# Patient Record
Sex: Female | Born: 1985 | Race: Black or African American | Hispanic: No | Marital: Married | Smoking: Former smoker
Health system: Southern US, Community
[De-identification: ages and names within clinical notes are randomized; demographics above are authoritative.]

## PROBLEM LIST (undated history)

## (undated) DIAGNOSIS — J40 Bronchitis, not specified as acute or chronic: Secondary | ICD-10-CM

## (undated) DIAGNOSIS — Z789 Other specified health status: Secondary | ICD-10-CM

## (undated) HISTORY — PX: NO PAST SURGERIES: SHX2092

---

## 2009-09-17 ENCOUNTER — Emergency Department (HOSPITAL_COMMUNITY): Admission: EM | Admit: 2009-09-17 | Discharge: 2009-09-17 | Payer: Self-pay | Admitting: Emergency Medicine

## 2010-01-05 ENCOUNTER — Emergency Department (HOSPITAL_COMMUNITY): Admission: EM | Admit: 2010-01-05 | Discharge: 2010-01-05 | Payer: Self-pay | Admitting: Emergency Medicine

## 2010-02-20 ENCOUNTER — Emergency Department (HOSPITAL_COMMUNITY): Admission: EM | Admit: 2010-02-20 | Discharge: 2010-02-20 | Payer: Self-pay | Admitting: Emergency Medicine

## 2010-03-01 ENCOUNTER — Emergency Department (HOSPITAL_COMMUNITY): Admission: EM | Admit: 2010-03-01 | Discharge: 2010-03-01 | Payer: Self-pay | Admitting: Family Medicine

## 2010-05-07 ENCOUNTER — Emergency Department (HOSPITAL_COMMUNITY): Admission: EM | Admit: 2010-05-07 | Discharge: 2010-05-07 | Payer: Self-pay | Admitting: Emergency Medicine

## 2010-05-21 ENCOUNTER — Emergency Department (HOSPITAL_COMMUNITY)
Admission: EM | Admit: 2010-05-21 | Discharge: 2010-05-21 | Payer: Self-pay | Source: Home / Self Care | Admitting: Emergency Medicine

## 2010-07-01 ENCOUNTER — Emergency Department (HOSPITAL_COMMUNITY)
Admission: EM | Admit: 2010-07-01 | Discharge: 2010-07-01 | Payer: Self-pay | Source: Home / Self Care | Admitting: Emergency Medicine

## 2010-08-30 LAB — URINALYSIS, ROUTINE W REFLEX MICROSCOPIC
Bilirubin Urine: NEGATIVE
Glucose, UA: NEGATIVE mg/dL
Hgb urine dipstick: NEGATIVE
Protein, ur: NEGATIVE mg/dL
pH: 6 (ref 5.0–8.0)

## 2010-08-30 LAB — WET PREP, GENITAL
Clue Cells Wet Prep HPF POC: NONE SEEN
Trich, Wet Prep: NONE SEEN
WBC, Wet Prep HPF POC: NONE SEEN
Yeast Wet Prep HPF POC: NONE SEEN

## 2010-08-30 LAB — GC/CHLAMYDIA PROBE AMP, GENITAL: GC Probe Amp, Genital: NEGATIVE

## 2010-09-07 LAB — URINALYSIS, ROUTINE W REFLEX MICROSCOPIC
Bilirubin Urine: NEGATIVE
Glucose, UA: NEGATIVE mg/dL
Hgb urine dipstick: NEGATIVE
Ketones, ur: NEGATIVE mg/dL
Protein, ur: 30 mg/dL — AB
pH: 7 (ref 5.0–8.0)

## 2010-09-07 LAB — POCT PREGNANCY, URINE: Preg Test, Ur: NEGATIVE

## 2010-09-07 LAB — URINE MICROSCOPIC-ADD ON

## 2011-01-09 ENCOUNTER — Emergency Department (HOSPITAL_COMMUNITY)
Admission: EM | Admit: 2011-01-09 | Discharge: 2011-01-09 | Disposition: A | Discharge status: Home / Self Care | Payer: Medicaid Other | Attending: Emergency Medicine | Admitting: Emergency Medicine

## 2011-01-09 DIAGNOSIS — B373 Candidiasis of vulva and vagina: Secondary | ICD-10-CM | POA: Insufficient documentation

## 2011-01-09 DIAGNOSIS — N9089 Other specified noninflammatory disorders of vulva and perineum: Secondary | ICD-10-CM | POA: Insufficient documentation

## 2011-01-09 DIAGNOSIS — N949 Unspecified condition associated with female genital organs and menstrual cycle: Secondary | ICD-10-CM | POA: Insufficient documentation

## 2011-01-09 DIAGNOSIS — L293 Anogenital pruritus, unspecified: Secondary | ICD-10-CM | POA: Insufficient documentation

## 2011-01-09 DIAGNOSIS — N898 Other specified noninflammatory disorders of vagina: Secondary | ICD-10-CM | POA: Insufficient documentation

## 2011-01-09 DIAGNOSIS — B3731 Acute candidiasis of vulva and vagina: Secondary | ICD-10-CM | POA: Insufficient documentation

## 2011-01-09 LAB — WET PREP, GENITAL: Trich, Wet Prep: NONE SEEN

## 2011-01-09 LAB — URINALYSIS, ROUTINE W REFLEX MICROSCOPIC
Glucose, UA: NEGATIVE mg/dL
Hgb urine dipstick: NEGATIVE
Ketones, ur: NEGATIVE mg/dL
Leukocytes, UA: NEGATIVE
Protein, ur: NEGATIVE mg/dL
pH: 6 (ref 5.0–8.0)

## 2011-01-09 LAB — POCT PREGNANCY, URINE: Preg Test, Ur: NEGATIVE

## 2011-10-11 ENCOUNTER — Encounter (HOSPITAL_COMMUNITY): Payer: Self-pay

## 2011-10-11 ENCOUNTER — Emergency Department (HOSPITAL_COMMUNITY)
Admission: EM | Admit: 2011-10-11 | Discharge: 2011-10-11 | Disposition: A | Discharge status: Home / Self Care | Payer: Self-pay | Attending: Emergency Medicine | Admitting: Emergency Medicine

## 2011-10-11 DIAGNOSIS — N920 Excessive and frequent menstruation with regular cycle: Secondary | ICD-10-CM | POA: Insufficient documentation

## 2011-10-11 DIAGNOSIS — N898 Other specified noninflammatory disorders of vagina: Secondary | ICD-10-CM | POA: Insufficient documentation

## 2011-10-11 DIAGNOSIS — N760 Acute vaginitis: Secondary | ICD-10-CM | POA: Insufficient documentation

## 2011-10-11 DIAGNOSIS — B9689 Other specified bacterial agents as the cause of diseases classified elsewhere: Secondary | ICD-10-CM | POA: Insufficient documentation

## 2011-10-11 DIAGNOSIS — M549 Dorsalgia, unspecified: Secondary | ICD-10-CM | POA: Insufficient documentation

## 2011-10-11 DIAGNOSIS — N949 Unspecified condition associated with female genital organs and menstrual cycle: Secondary | ICD-10-CM | POA: Insufficient documentation

## 2011-10-11 DIAGNOSIS — A499 Bacterial infection, unspecified: Secondary | ICD-10-CM | POA: Insufficient documentation

## 2011-10-11 DIAGNOSIS — R109 Unspecified abdominal pain: Secondary | ICD-10-CM | POA: Insufficient documentation

## 2011-10-11 LAB — URINE MICROSCOPIC-ADD ON

## 2011-10-11 LAB — CBC
HCT: 39.3 % (ref 36.0–46.0)
MCV: 88.9 fL (ref 78.0–100.0)
Platelets: 260 10*3/uL (ref 150–400)
RBC: 4.42 MIL/uL (ref 3.87–5.11)
RDW: 12.5 % (ref 11.5–15.5)
WBC: 3 10*3/uL — ABNORMAL LOW (ref 4.0–10.5)

## 2011-10-11 LAB — URINALYSIS, ROUTINE W REFLEX MICROSCOPIC
Glucose, UA: NEGATIVE mg/dL
Leukocytes, UA: NEGATIVE
Protein, ur: 30 mg/dL — AB
Specific Gravity, Urine: 1.026 (ref 1.005–1.030)
Urobilinogen, UA: 0.2 mg/dL (ref 0.0–1.0)

## 2011-10-11 LAB — WET PREP, GENITAL
Trich, Wet Prep: NONE SEEN
Yeast Wet Prep HPF POC: NONE SEEN

## 2011-10-11 LAB — BASIC METABOLIC PANEL
CO2: 24 mEq/L (ref 19–32)
Chloride: 102 mEq/L (ref 96–112)
Glucose, Bld: 95 mg/dL (ref 70–99)
Sodium: 138 mEq/L (ref 135–145)

## 2011-10-11 LAB — POCT PREGNANCY, URINE: Preg Test, Ur: NEGATIVE

## 2011-10-11 LAB — DIFFERENTIAL
Basophils Absolute: 0 10*3/uL (ref 0.0–0.1)
Lymphocytes Relative: 46 % (ref 12–46)
Lymphs Abs: 1.4 10*3/uL (ref 0.7–4.0)
Neutro Abs: 1.3 10*3/uL — ABNORMAL LOW (ref 1.7–7.7)

## 2011-10-11 MED ORDER — KETOROLAC TROMETHAMINE 60 MG/2ML IM SOLN
60.0000 mg | Freq: Once | INTRAMUSCULAR | Status: AC
  Administered 2011-10-11: 60 mg via INTRAMUSCULAR
  Filled 2011-10-11: qty 2

## 2011-10-11 MED ORDER — POTASSIUM CHLORIDE CRYS ER 20 MEQ PO TBCR
40.0000 meq | EXTENDED_RELEASE_TABLET | Freq: Once | ORAL | Status: AC
  Administered 2011-10-11: 40 meq via ORAL
  Filled 2011-10-11: qty 2

## 2011-10-11 MED ORDER — METRONIDAZOLE 500 MG PO TABS: 500.0000 mg | ORAL_TABLET | Freq: Two times a day (BID) | ORAL | Status: AC

## 2011-10-11 MED ORDER — OXYCODONE-ACETAMINOPHEN 5-325 MG PO TABS
1.0000 | ORAL_TABLET | Freq: Once | ORAL | Status: AC
  Administered 2011-10-11: 1 via ORAL
  Filled 2011-10-11: qty 1

## 2011-10-11 MED ORDER — METRONIDAZOLE 500 MG PO TABS: 500.0000 mg | ORAL_TABLET | Freq: Two times a day (BID) | ORAL | Status: DC

## 2011-10-11 NOTE — ED Notes (Signed)
Patient reporting heavy vaginal bleeding during last menstrual cycle which lasted for approx 7 days.  Patient reporting this months cycle started on 10-04-11 and is lighter but has been associated with right flank pain and abdominal cramping.  Patient reporting vaginal bleeding for 8 days and goes through 1 tampon every 4 hours.  Patient reports she normally does not bleed for this long.

## 2011-10-11 NOTE — ED Provider Notes (Signed)
History     CSN: [id number]  Arrival date & time 10/11/11  0809   First MD Initiated Contact with Patient 10/11/11 0818      Chief Complaint  Patient presents with  . Vaginal Bleeding    (Consider location/radiation/quality/duration/timing/severity/associated sxs/prior treatment) HPI Comments: Patient with no significant past medical history presents emergency department chief complaint of vaginal bleeding and abdominal pain.  LMP current, started 4/17, usually lasts 3-4 days. Using 1 super tampon q 2-3 hours Last month lasted 7 days and was heavy, associated with abdominal cramping & back pain relieved with warm bath. G1P1, early delivery, no other complications (daughter is 8). Pt does have OBGYN at [facility] for yearly check ups with no abnormalities last year in June. No hx of prior STDs. Pt is currently sexually active, but does not use protection & is not on birth control.  Associated s/s include back pain and "pain in my vagina area". Onset was when cycle started, pain is described as sharp "labor pain", Not associated w N/V/D, LNBM this morning. Severity is 10/10 intermittent in nature lasting about an hour or 2. Taken grandmothers hydrocodine with no relief.  No other complaints at this time  Patient is a 26 y.o. female presenting with vaginal bleeding.  Vaginal Bleeding This is a new problem. The current episode started more than 1 month ago. The problem occurs intermittently. The problem has been waxing and waning. Associated symptoms include abdominal pain. Pertinent negatives include no anorexia, arthralgias, change in bowel habit, chest pain, chills, congestion, coughing, diaphoresis, fatigue, fever, headaches, joint swelling, myalgias, nausea, neck pain, numbness, rash, sore throat, swollen glands, urinary symptoms, vertigo, visual change, vomiting or weakness. Associated symptoms comments: No abnormal vaginal discharge. The symptoms are aggravated by nothing. She has tried nothing  for the symptoms.    History reviewed. No pertinent past medical history.  History reviewed. No pertinent past surgical history.  No family history on file.  History  Substance Use Topics  . Smoking status: Former Games developer  . Smokeless tobacco: Not on file  . Alcohol Use: Yes    OB History    Grav Para Term Preterm Abortions TAB SAB Ect Mult Living   1 1  1      1       Review of Systems  Constitutional: Negative for fever, chills, diaphoresis and fatigue.  HENT: Negative for congestion, sore throat and neck pain.   Respiratory: Negative for cough.   Cardiovascular: Negative for chest pain.  Gastrointestinal: Positive for abdominal pain. Negative for nausea, vomiting, anorexia and change in bowel habit.  Genitourinary: Positive for vaginal bleeding.  Musculoskeletal: Negative for myalgias, joint swelling and arthralgias.  Skin: Negative for rash.  Neurological: Negative for vertigo, weakness, numbness and headaches.  All other systems reviewed and are negative.    Allergies  Review of patient's allergies indicates no known allergies.  Home Medications   Current Outpatient Rx  Name Route Sig Dispense Refill  . ACETAMINOPHEN 500 MG PO TABS Oral Take 2,000 mg by mouth every 6 (six) hours as needed. For pain.      BP 120/82  Pulse 72  Temp(Src) 98.6 F (37 C) (Oral)  Resp 16  SpO2 100%  LMP 10/04/2011  Physical Exam  Nursing note and vitals reviewed. Constitutional: She is oriented to person, place, and time. She appears well-developed and well-nourished. No distress.  HENT:  Head: Normocephalic and atraumatic.  Eyes: Conjunctivae and EOM are normal.  Neck: Normal range of motion.  Cardiovascular:       Regular rate and rhythm, no abnormal sounds auscultation  Pulmonary/Chest: Effort normal.       Lungs clear to auscultation bilaterally  Abdominal:         Soft, nondistended abdomen with normal bowel sounds.  Suprapubic tenderness to palpation.     Genitourinary:       No CVA tenderness Exam performed by Jaci Carrel,  exam chaperoned Date: 10/11/2011 Pelvic exam: normal external genitalia without evidence of trauma. VULVA: normal appearing vulva with no masses, tenderness or lesion. VAGINA: normal appearing vagina with normal color and discharge, no lesions. CERVIX: normal appearing cervix without lesions, cervical motion tenderness absent, cervical os closed with out purulent discharge; vaginal discharge - clear, Wet prep and DNA probe for chlamydia and GC obtained. Blood present    ADNEXA: normal adnexa in size, nontender and no masses UTERUS: uterus is normal size, shape, consistency and nontender.    Musculoskeletal: Normal range of motion.  Neurological: She is alert and oriented to person, place, and time.  Skin: Skin is warm and dry. No rash noted. She is not diaphoretic.  Psychiatric: She has a normal mood and affect. Her behavior is normal.    ED Course  Procedures (including critical care time)  Labs Reviewed  URINALYSIS, ROUTINE W REFLEX MICROSCOPIC - Abnormal; Notable for the following:    APPearance CLOUDY (*)    Hgb urine dipstick LARGE (*)    Protein, ur 30 (*)    All other components within normal limits  CBC - Abnormal; Notable for the following:    WBC 3.0 (*)    All other components within normal limits  DIFFERENTIAL - Abnormal; Notable for the following:    Neutro Abs 1.3 (*)    All other components within normal limits  BASIC METABOLIC PANEL - Abnormal; Notable for the following:    Potassium 3.3 (*)    All other components within normal limits  WET PREP, GENITAL - Abnormal; Notable for the following:    Clue Cells Wet Prep HPF POC RARE (*)    WBC, Wet Prep HPF POC RARE (*)    All other components within normal limits  URINE MICROSCOPIC-ADD ON - Abnormal; Notable for the following:    Squamous Epithelial / LPF MANY (*)    Casts HYALINE CASTS (*)    All other components within normal limits   POCT PREGNANCY, URINE  GC/CHLAMYDIA PROBE AMP, GENITAL   No results found.   No diagnosis found.    MDM  Menorrhagia  Patient has been advised to followup with her OB/GYN for further evaluation of her menorrhagia.  Patient will be treated for bacterial vaginosis.  Patient has mild hypokalemia which has been replaced while in the emergency department.  Patient has been hemodynamically stable in no acute distress throughout the entire hospital stay and prior to discharge patient is asymptomatic.  Patient has been educated not to drink alcohol while taking Flagyl.        Jaci Carrel, PA-C 10/11/11 [address] [place] 10/11/11 1202

## 2011-10-11 NOTE — Discharge Instructions (Signed)

## 2011-10-12 LAB — GC/CHLAMYDIA PROBE AMP, GENITAL: GC Probe Amp, Genital: NEGATIVE

## 2011-10-14 NOTE — ED Provider Notes (Signed)
Medical screening examination/treatment/procedure(s) were performed by non-physician practitioner and as supervising physician I was immediately available for consultation/collaboration.  Jermayne Sweeney R. Eiman Maret, MD 10/14/11 0701 

## 2012-07-09 ENCOUNTER — Encounter (HOSPITAL_COMMUNITY): Payer: Self-pay | Admitting: Emergency Medicine

## 2012-07-09 ENCOUNTER — Emergency Department (HOSPITAL_COMMUNITY): Payer: Self-pay

## 2012-07-09 ENCOUNTER — Emergency Department (HOSPITAL_COMMUNITY)
Admission: EM | Admit: 2012-07-09 | Discharge: 2012-07-09 | Disposition: A | Discharge status: Home / Self Care | Payer: Self-pay | Attending: Emergency Medicine | Admitting: Emergency Medicine

## 2012-07-09 DIAGNOSIS — Z3202 Encounter for pregnancy test, result negative: Secondary | ICD-10-CM | POA: Insufficient documentation

## 2012-07-09 DIAGNOSIS — R197 Diarrhea, unspecified: Secondary | ICD-10-CM | POA: Insufficient documentation

## 2012-07-09 DIAGNOSIS — R5381 Other malaise: Secondary | ICD-10-CM | POA: Insufficient documentation

## 2012-07-09 DIAGNOSIS — R61 Generalized hyperhidrosis: Secondary | ICD-10-CM | POA: Insufficient documentation

## 2012-07-09 DIAGNOSIS — J029 Acute pharyngitis, unspecified: Secondary | ICD-10-CM | POA: Insufficient documentation

## 2012-07-09 DIAGNOSIS — R0602 Shortness of breath: Secondary | ICD-10-CM | POA: Insufficient documentation

## 2012-07-09 DIAGNOSIS — Z87891 Personal history of nicotine dependence: Secondary | ICD-10-CM | POA: Insufficient documentation

## 2012-07-09 DIAGNOSIS — R079 Chest pain, unspecified: Secondary | ICD-10-CM | POA: Insufficient documentation

## 2012-07-09 DIAGNOSIS — R112 Nausea with vomiting, unspecified: Secondary | ICD-10-CM | POA: Insufficient documentation

## 2012-07-09 DIAGNOSIS — J40 Bronchitis, not specified as acute or chronic: Secondary | ICD-10-CM | POA: Insufficient documentation

## 2012-07-09 MED ORDER — AEROCHAMBER Z-STAT PLUS/MEDIUM MISC
1.0000 | Freq: Once | Status: AC
  Administered 2012-07-09: 1

## 2012-07-09 MED ORDER — IBUPROFEN 600 MG PO TABS: 600.0000 mg | ORAL_TABLET | Freq: Four times a day (QID) | ORAL | Status: DC | PRN

## 2012-07-09 MED ORDER — IBUPROFEN 200 MG PO TABS
600.0000 mg | ORAL_TABLET | Freq: Once | ORAL | Status: AC
  Administered 2012-07-09: 600 mg via ORAL
  Filled 2012-07-09: qty 3

## 2012-07-09 MED ORDER — ALBUTEROL SULFATE HFA 108 (90 BASE) MCG/ACT IN AERS
2.0000 | INHALATION_SPRAY | RESPIRATORY_TRACT | Status: DC | PRN
  Administered 2012-07-09: 2 via RESPIRATORY_TRACT
  Filled 2012-07-09: qty 6.7

## 2012-07-09 NOTE — ED Provider Notes (Signed)
History     CSN: [id number]  Arrival date & time 07/09/12  4098   First MD Initiated Contact with Patient 07/09/12 [phone]      Chief Complaint  Patient presents with  . Cough  . Shortness of Breath    (Consider location/radiation/quality/duration/timing/severity/associated sxs/prior treatment) HPI Comments: Patient presents with 4 day history of a non productive cough and SOB.  She states her chest is sore from coughing so much.  She reports rhinorrhea and head congestion.  She states that she has experienced intermittent episodes of vomiting and diarrhea over the past 24 hours.  She does not notice blood in her vomit or stool.  She denies fever.  Patient reports that family and friends have had similar symptoms.  She did not receive the flu vaccine this year.  She has tried to take Robitussin and has been drinking herbal tea which has not been helping.  No recent surgeries or long travel, no estrogen use, no h/o of blood clots, no lower extremity swelling.  Onset gradual, course constant.    Patient is a 27 y.o. female presenting with cough and shortness of breath. The history is provided by the patient.  Cough Associated symptoms include chest pain, rhinorrhea, sore throat and shortness of breath. Pertinent negatives include no chills, no ear pain, no headaches and no wheezing.  Shortness of Breath  Associated symptoms include chest pain, rhinorrhea, sore throat, cough and shortness of breath. Pertinent negatives include no fever and no wheezing.    History reviewed. No pertinent past medical history.  History reviewed. No pertinent past surgical history.  History reviewed. No pertinent family history.  History  Substance Use Topics  . Smoking status: Former Games developer  . Smokeless tobacco: Not on file  . Alcohol Use: Yes    OB History    Grav Para Term Preterm Abortions TAB SAB Ect Mult Living   1 1  1      1       Review of Systems  Constitutional: Positive for diaphoresis,  appetite change and fatigue. Negative for fever and chills.  HENT: Positive for congestion, sore throat, rhinorrhea and sinus pressure. Negative for ear pain and neck pain.   Eyes: Negative for pain and discharge.  Respiratory: Positive for cough and shortness of breath. Negative for choking and wheezing.   Cardiovascular: Positive for chest pain.  Gastrointestinal: Positive for nausea, vomiting and diarrhea. Negative for abdominal pain, constipation and blood in stool.  Genitourinary: Negative for dysuria and urgency.  Skin: Negative for rash.  Neurological: Negative for dizziness, light-headedness and headaches.    Allergies  Review of patient's allergies indicates no known allergies.  Home Medications   Current Outpatient Rx  Name  Route  Sig  Dispense  Refill  . ACETAMINOPHEN 500 MG PO TABS   Oral   Take 2,000 mg by mouth every 6 (six) hours as needed. For pain.           BP 129/93  Pulse 107  Temp 98.8 F (37.1 C) (Oral)  Resp 20  SpO2 96%  Physical Exam  Constitutional: She is oriented to person, place, and time. She appears well-developed and well-nourished.  HENT:  Head: Normocephalic and atraumatic.  Right Ear: Tympanic membrane and external ear normal.  Left Ear: Tympanic membrane and external ear normal.  Nose: Nose normal.  Mouth/Throat: Oropharynx is clear and moist. No oropharyngeal exudate.  Eyes: Conjunctivae normal are normal. Pupils are equal, round, and reactive to light.  Neck: Normal  range of motion. Neck supple.  Cardiovascular: Normal rate, regular rhythm and normal heart sounds.   Pulmonary/Chest: Effort normal. She has wheezes.  Abdominal: Soft. Bowel sounds are normal. There is no tenderness.  Lymphadenopathy:    She has no cervical adenopathy.  Neurological: She is alert and oriented to person, place, and time.  Skin: Skin is warm and dry.    ED Course  Procedures (including critical care time)   Labs Reviewed  PREGNANCY, URINE    Dg Chest 2 View  07/09/2012  *RADIOLOGY REPORT*  Clinical Data: Cough, congestion, and shortness of breath.  Mid chest pain.  CHEST - 2 VIEW  Comparison: None.  Findings: Symmetric nodular densities compatible with nipple shadows project over the lung bases.  Airway thickening may reflect bronchitis or reactive airways disease.  No airspace opacity is identified to suggest bacterial pneumonia pattern.   Cardiac and mediastinal contours appear unremarkable.  No pleural effusion identified.  IMPRESSION:  1. Airway thickening may reflect bronchitis or reactive airways disease.  No airspace opacity is identified to suggest bacterial pneumonia pattern.   Original Report Authenticated By: Gaylyn Rong, M.D.      1. Bronchitis     Patient seen and examined. Work-up initiated.   Vital signs reviewed and are as follows: Filed Vitals:   07/09/12 0637  BP: 129/93  Pulse: 107  Temp: 98.8 F (37.1 C)  Resp: 20   CXR results reviewed.   Patient counseled on use of albuterol HFA.  Told to use 1-2 puffs q 4 hours as needed for SOB.  Patient counseled on supportive care for viral URI/bronchitis and s/s to return including worsening symptoms, persistent fever, persistent vomiting, or if they have any other concerns.  Urged to see PCP if symptoms persist for more than 3 days. Patient verbalizes understanding and agrees with plan.   BP 139/88  Pulse 100  Temp 98.8 F (37.1 C) (Oral)  Resp 20  SpO2 99%  LMP 06/18/2012  MDM  Patient with likely viral bronchitis. No concern for PNA given normal lung exam/x-ray. Antibiotics not indicated. Conservative therapy indicated. Do not suspect PE: no leg swelling, h/o blood clots, pleuritic CP, estrogen use, smoking, recent surgery or immobilization.          Renne Crigler, [place] 07/09/12 1105

## 2012-07-09 NOTE — ED Notes (Signed)
[id number]> Expected date:<BR> Expected time:<BR> Means of arrival:<BR> Comments:<BR> EMS/26 yo female with cough and SOB

## 2012-07-09 NOTE — ED Notes (Signed)
Brought in by EMS from home with c/o cough x 4 days now with shortness of breath.

## 2012-07-09 NOTE — ED Notes (Signed)
Pt reports frequent cough and congestions that preceded chest pain 7/10 nonradiating that increases with deep breathing. Also reports intermittant fever and body aches . Admits to taking OTC Robatussin with minimal relief oc cough.

## 2012-07-14 NOTE — ED Provider Notes (Signed)
Medical screening examination/treatment/procedure(s) were performed by non-physician practitioner and as supervising physician I was immediately available for consultation/collaboration.  Tobin Chad, MD 07/14/12 [phone]

## 2012-11-18 ENCOUNTER — Emergency Department (HOSPITAL_COMMUNITY)
Admission: EM | Admit: 2012-11-18 | Discharge: 2012-11-18 | Disposition: A | Discharge status: Home / Self Care | Payer: Medicaid Other | Attending: Emergency Medicine | Admitting: Emergency Medicine

## 2012-11-18 ENCOUNTER — Other Ambulatory Visit: Payer: Medicaid Other

## 2012-11-18 ENCOUNTER — Encounter (HOSPITAL_COMMUNITY): Payer: Self-pay | Admitting: Emergency Medicine

## 2012-11-18 DIAGNOSIS — N6320 Unspecified lump in the left breast, unspecified quadrant: Secondary | ICD-10-CM

## 2012-11-18 DIAGNOSIS — Z87891 Personal history of nicotine dependence: Secondary | ICD-10-CM | POA: Insufficient documentation

## 2012-11-18 DIAGNOSIS — N63 Unspecified lump in unspecified breast: Secondary | ICD-10-CM

## 2012-11-18 NOTE — ED Notes (Signed)
Patient states that she has a lump in left breast that hurts.  Patient states she went to health department and they told her to get it checked out.

## 2012-11-18 NOTE — ED Provider Notes (Signed)
History     CSN: [id number]  Arrival date & time 11/18/12  0718   First MD Initiated Contact with Patient 11/18/12 [phone]      Chief Complaint  Patient presents with  . Breast Pain    (Consider location/radiation/quality/duration/timing/severity/associated sxs/prior treatment) Patient is a 27 y.o. female presenting with general illness. The history is provided by the patient.  Illness Location:  Pt has noted a mass in the left breats.  She says that her breasts become tender before menses.  She has noted a mass in the left breast.  She went to the [facility] on Friday, 3 days ago, and was advised to "have it checked." Severity:  Moderate Duration: Noted by a doctor at the [facility] 3 days ago. Timing:  Constant Progression:  Unchanged Chronicity:  New Associated symptoms: no fever     History reviewed. No pertinent past medical history.  History reviewed. No pertinent past surgical history.  No family history on file.  History  Substance Use Topics  . Smoking status: Former Games developer  . Smokeless tobacco: Not on file  . Alcohol Use: Yes    OB History   Grav Para Term Preterm Abortions TAB SAB Ect Mult Living   1 1  1      1       Review of Systems  Constitutional: Negative for fever and chills.  HENT: Negative.   Eyes: Negative.   Respiratory: Negative.   Cardiovascular: Negative.   Gastrointestinal: Negative.   Genitourinary: Negative.        Mass in left breast.  Musculoskeletal: Negative.   Skin: Negative.   Neurological: Negative.   Psychiatric/Behavioral: Negative.     Allergies  Review of patient's allergies indicates no known allergies.  Home Medications   Current Outpatient Rx  Name  Route  Sig  Dispense  Refill  . acetaminophen (TYLENOL) 500 MG tablet   Oral   Take 2,000 mg by mouth every 6 (six) hours as needed. For pain.         Marland Kitchen ibuprofen (ADVIL,MOTRIN) 600 MG tablet   Oral   Take 1 tablet (600 mg total) by mouth every 6  (six) hours as needed for pain.   20 tablet   0     BP 119/62  Pulse 73  Temp(Src) 98 F (36.7 C) (Oral)  Resp 15  Ht 5\' 6"  (1.676 m)  Wt 122 lb (55.339 kg)  BMI 19.7 kg/m2  SpO2 100%  LMP 11/03/2012  Physical Exam  Constitutional: She is oriented to person, place, and time. She appears well-developed and well-nourished. No distress.  HENT:  Head: Normocephalic and atraumatic.  Eyes: Conjunctivae and EOM are normal.  Neck: Normal range of motion. Neck supple.  Cardiovascular: Normal rate, regular rhythm and normal heart sounds.   Pulmonary/Chest: Effort normal and breath sounds normal.  She has a 2 x 2 mass in the left breast in the left lateral area.  The skin is intact.  There is no nipple discharge.  There is no axillary adenopathy.  Her right breast is normal, without any mass.  Abdominal: Soft. Bowel sounds are normal.  Neurological: She is alert and oriented to person, place, and time.  No sensory or motor deficit.  Skin: Skin is warm and dry.  Psychiatric: She has a normal mood and affect. Her behavior is normal.    ED Course  Procedures (including critical care time)  7:43 AM Call to [facility] at [phone] to arrange for mammogram.  7:51 AM Pt to have ultrasound of the left breast at 8:45 A.M. Today at the [facility].      1. Mass of breast, left            Carleene Cooper III, MD 11/18/12 [phone]

## 2012-11-20 ENCOUNTER — Telehealth (HOSPITAL_COMMUNITY): Payer: Self-pay | Admitting: *Deleted

## 2012-11-20 NOTE — Telephone Encounter (Signed)
Telephoned patient at home # and left message to return call to [facility] 

## 2013-01-21 ENCOUNTER — Encounter (HOSPITAL_COMMUNITY): Payer: Self-pay | Admitting: Emergency Medicine

## 2013-01-21 ENCOUNTER — Emergency Department (INDEPENDENT_AMBULATORY_CARE_PROVIDER_SITE_OTHER)
Admission: EM | Admit: 2013-01-21 | Discharge: 2013-01-21 | Disposition: A | Discharge status: Home / Self Care | Payer: Medicaid Other | Source: Home / Self Care

## 2013-01-21 DIAGNOSIS — K0889 Other specified disorders of teeth and supporting structures: Secondary | ICD-10-CM

## 2013-01-21 DIAGNOSIS — K089 Disorder of teeth and supporting structures, unspecified: Secondary | ICD-10-CM

## 2013-01-21 MED ORDER — AMOXICILLIN 500 MG PO CAPS: 500.0000 mg | ORAL_CAPSULE | Freq: Three times a day (TID) | ORAL | Status: DC

## 2013-01-21 MED ORDER — HYDROCODONE-ACETAMINOPHEN 5-325 MG PO TABS: 1.0000 | ORAL_TABLET | Freq: Four times a day (QID) | ORAL | Status: DC | PRN

## 2013-01-21 NOTE — ED Notes (Signed)
C/o right side wisdom tooth pain which started three weeks ago.   Aleve taken.   Dentist states to patient he doesn't pull out but did refer her [place] [address].

## 2013-01-21 NOTE — ED Provider Notes (Signed)
Linda Mcmahon is a 27 y.o. female who presents to Urgent Care today for dental pain. Patient has significant pain in her right lower wisdom tooth. She was seen by Maurine Minister 3 weeks ago and diagnosed with acute impaction referred her to a oral surgeon. However she just received Medicaid and would like to be referred back to the oral surgeon if possible. She notes her pain is continued to worsen. She's tried over-the-counter pain medications which have not helped yet. She notes the pain is moderate to severe and worse with eating. She denies any fevers or chills or trouble swallowing. She feels well otherwise.    PMH reviewed. Healthy otherwise History  Substance Use Topics  . Smoking status: Former Games developer  . Smokeless tobacco: Not on file  . Alcohol Use: Yes   ROS as above Medications reviewed. No current facility-administered medications for this encounter.   Current Outpatient Prescriptions  Medication Sig Dispense Refill  . amoxicillin (AMOXIL) 500 MG capsule Take 1 capsule (500 mg total) by mouth 3 (three) times daily.  60 capsule  0  . HYDROcodone-acetaminophen (NORCO) 5-325 MG per tablet Take 1 tablet by mouth every 6 (six) hours as needed for pain.  20 tablet  0    Exam:  BP 119/79  Pulse 66  Temp(Src) 97.9 F (36.6 C) (Oral)  Resp 16  SpO2 100%  LMP 01/16/2013 Gen: Well NAD HEENT: EOMI,  MMM, erythematous appearing gums behind the last molar on the bottom right.  Tender to touch. Molars and premolars are normal appearing Lungs: CTABL Nl WOB Heart: RRR no MRG Abd: NABS, NT, ND Exts: Non edematous BL  LE, warm and well perfused.   No results found for this or any previous visit (from the past 24 hour(s)). No results found.  Assessment and Plan: 27 y.o. female with wisdom tooth pain. Plan to treat with amoxicillin and hydrocodone. Encourage patient to follow back up with her dentist for referral to oral surgeon.  Discussed warning signs or symptoms. Please see discharge  instructions. Patient expresses understanding.    3  Rodolph Bong, MD 01/21/13 [phone]

## 2013-02-17 ENCOUNTER — Encounter (HOSPITAL_COMMUNITY): Payer: Self-pay

## 2013-02-17 ENCOUNTER — Emergency Department (HOSPITAL_COMMUNITY)
Admission: EM | Admit: 2013-02-17 | Discharge: 2013-02-17 | Disposition: A | Discharge status: Home / Self Care | Payer: Medicaid Other | Attending: Emergency Medicine | Admitting: Emergency Medicine

## 2013-02-17 DIAGNOSIS — R51 Headache: Secondary | ICD-10-CM | POA: Insufficient documentation

## 2013-02-17 DIAGNOSIS — Z792 Long term (current) use of antibiotics: Secondary | ICD-10-CM | POA: Insufficient documentation

## 2013-02-17 DIAGNOSIS — Z3202 Encounter for pregnancy test, result negative: Secondary | ICD-10-CM | POA: Insufficient documentation

## 2013-02-17 DIAGNOSIS — N949 Unspecified condition associated with female genital organs and menstrual cycle: Secondary | ICD-10-CM | POA: Insufficient documentation

## 2013-02-17 DIAGNOSIS — Z87891 Personal history of nicotine dependence: Secondary | ICD-10-CM | POA: Insufficient documentation

## 2013-02-17 DIAGNOSIS — N938 Other specified abnormal uterine and vaginal bleeding: Secondary | ICD-10-CM | POA: Insufficient documentation

## 2013-02-17 LAB — WET PREP, GENITAL: Trich, Wet Prep: NONE SEEN

## 2013-02-17 LAB — URINALYSIS W MICROSCOPIC + REFLEX CULTURE
Bilirubin Urine: NEGATIVE
Hgb urine dipstick: NEGATIVE
Protein, ur: NEGATIVE mg/dL
Specific Gravity, Urine: 1.025 (ref 1.005–1.030)
Urobilinogen, UA: 1 mg/dL (ref 0.0–1.0)

## 2013-02-17 LAB — POCT I-STAT, CHEM 8
Calcium, Ion: 1.2 mmol/L (ref 1.12–1.23)
Glucose, Bld: 88 mg/dL (ref 70–99)
HCT: 36 % (ref 36.0–46.0)
Hemoglobin: 12.2 g/dL (ref 12.0–15.0)

## 2013-02-17 LAB — POCT PREGNANCY, URINE: Preg Test, Ur: NEGATIVE

## 2013-02-17 NOTE — ED Provider Notes (Signed)
CSN: [id number]     Arrival date & time 02/17/13  0856 History   First MD Initiated Contact with Patient 02/17/13 0901     Chief Complaint  Patient presents with  . Vaginal Bleeding   (Consider location/radiation/quality/duration/timing/severity/associated sxs/prior Treatment) HPI Patient is a 27 year old female who presents to emergency department with complaint of vaginal bleeding and headaches. Patient states that her vaginal bleeding began 8 days ago and has been persistent over that time. States her last period prior to that was on August 1 which was 30 days ago, states it was normal. States she usually has states she is normally regular. Patient denies any abdominal pain or any other vaginal complaints. Patient denies any fever, chills, nausea, vomiting. States she has had severe headaches in the last few days. Patient also admits to being stressed the last several weeks. States that she has not been sleeping or eating well. Patient believes headache may be attributed to lack of sleep. Patient denies taking any medications right now except for amoxicillin she was given for a dental abscess. Patient not sure if she's pregnant. History reviewed. No pertinent past medical history. History reviewed. No pertinent past surgical history. History reviewed. No pertinent family history. History  Substance Use Topics  . Smoking status: Former Games developer  . Smokeless tobacco: Not on file  . Alcohol Use: Yes   OB History   Grav Para Term Preterm Abortions TAB SAB Ect Mult Living   1 1  1      1      Review of Systems  Constitutional: Negative for fever and chills.  HENT: Negative for neck pain and neck stiffness.   Respiratory: Negative for cough, chest tightness and shortness of breath.   Cardiovascular: Negative for chest pain, palpitations and leg swelling.  Gastrointestinal: Negative for nausea, vomiting, abdominal pain and diarrhea.  Genitourinary: Positive for vaginal bleeding. Negative for  dysuria, flank pain, vaginal discharge, vaginal pain and pelvic pain.  Musculoskeletal: Negative for myalgias and arthralgias.  Skin: Negative for rash.  Neurological: Positive for headaches. Negative for dizziness and weakness.  All other systems reviewed and are negative.    Allergies  Review of patient's allergies indicates no known allergies.  Home Medications   Current Outpatient Rx  Name  Route  Sig  Dispense  Refill  . ibuprofen (ADVIL,MOTRIN) 200 MG tablet   Oral   Take 600 mg by mouth every 6 (six) hours as needed for pain.         Marland Kitchen amoxicillin (AMOXIL) 500 MG capsule   Oral   Take 1 capsule (500 mg total) by mouth 3 (three) times daily.   60 capsule   0    BP 111/60  Pulse 65  Temp(Src) 98 F (36.7 C) (Oral)  Resp 20  Ht 5\' 5"  (1.651 m)  Wt 125 lb (56.7 kg)  BMI 20.8 kg/m2  SpO2 99%  LMP 02/17/2013 Physical Exam  Nursing note and vitals reviewed. Constitutional: She appears well-developed and well-nourished. No distress.  HENT:  Head: Normocephalic.  Eyes: Conjunctivae are normal.  Neck: Neck supple.  Cardiovascular: Normal rate, regular rhythm and normal heart sounds.   Pulmonary/Chest: Effort normal and breath sounds normal. No respiratory distress. She has no wheezes. She has no rales.  Abdominal: Soft. Bowel sounds are normal. She exhibits no distension. There is no tenderness. There is no rebound.  Genitourinary: Vagina normal and uterus normal. Cervix exhibits no motion tenderness. Right adnexum displays no mass and no tenderness. Left adnexum  displays no mass and no tenderness. No erythema, tenderness or bleeding around the vagina. No vaginal discharge found.  Musculoskeletal: She exhibits no edema.  Neurological: She is alert.  Skin: Skin is warm and dry.  Psychiatric: She has a normal mood and affect. Her behavior is normal.    ED Course  Procedures (including critical care time)  Results for orders placed during the hospital encounter of  02/17/13  WET PREP, GENITAL      Result Value Range   Yeast Wet Prep HPF POC NONE SEEN  NONE SEEN   Trich, Wet Prep NONE SEEN  NONE SEEN   Clue Cells Wet Prep HPF POC NONE SEEN  NONE SEEN   WBC, Wet Prep HPF POC FEW (*) NONE SEEN  URINALYSIS W MICROSCOPIC + REFLEX CULTURE      Result Value Range   Color, Urine YELLOW  YELLOW   APPearance CLOUDY (*) CLEAR   Specific Gravity, Urine 1.025  1.005 - 1.030   pH 6.0  5.0 - 8.0   Glucose, UA NEGATIVE  NEGATIVE mg/dL   Hgb urine dipstick NEGATIVE  NEGATIVE   Bilirubin Urine NEGATIVE  NEGATIVE   Ketones, ur NEGATIVE  NEGATIVE mg/dL   Protein, ur NEGATIVE  NEGATIVE mg/dL   Urobilinogen, UA 1.0  0.0 - 1.0 mg/dL   Nitrite NEGATIVE  NEGATIVE   Leukocytes, UA NEGATIVE  NEGATIVE   WBC, UA 0-2  <3 WBC/hpf   RBC / HPF 0-2  <3 RBC/hpf   Bacteria, UA RARE  RARE   Squamous Epithelial / LPF FEW (*) RARE   Urine-Other MUCOUS PRESENT    POCT PREGNANCY, URINE      Result Value Range   Preg Test, Ur NEGATIVE  NEGATIVE  POCT I-STAT, CHEM 8      Result Value Range   Sodium 142  135 - 145 mEq/L   Potassium 4.0  3.5 - 5.1 mEq/L   Chloride 107  96 - 112 mEq/L   BUN 10  6 - 23 mg/dL   Creatinine, Ser 9.60  0.50 - 1.10 mg/dL   Glucose, Bld 88  70 - 99 mg/dL   Calcium, Ion 4.54  1.12 - 1.23 mmol/L   TCO2 24  0 - 100 mmol/L   Hemoglobin 12.2  12.0 - 15.0 g/dL   HCT 09.8  11.9 - 14.7 %   No results found.    MDM   1. Dysfunctional uterine bleeding      Patient with irregular menstrual periods. No abdominal pain or cramping. Urine pregnancy is negative. UA and pelvic exam are unremarkable. In fact on today's exam there is no bleeding. Patient's electrolytes and hemoglobin are normal. Her vital signs are normal as well. Patient will be discharged home with outpatient followup as needed.  Filed Vitals:   02/17/13 0902  BP: 111/60  Pulse: 65  Temp: 98 F (36.7 C)  TempSrc: Oral  Resp: 20  Height: 5\' 5"  (1.651 m)  Weight: 125 lb (56.7 kg)   SpO2: 99%      Tatyana A Kirichenko, PA-C 02/17/13 1101  Medical screening examination/treatment/procedure(s) were conducted as a shared visit with non-physician practitioner(s) or resident  and myself.  I personally evaluated the patient during the encounter and agree with the findings and plan unless otherwise indicated.    Longer menstrual cycle than normal.  No syncope or heavy bleeding.  No abd pain or fevers.  Pt feels well in ED.  Labs unremarkable.  Close fup discussed.  Abd  soft/ nt, mmm, rrr, well appearing.    Enid Skeens, MD 02/17/13 [phone]

## 2013-02-17 NOTE — ED Notes (Signed)
Pt reports 2nd round of menstrual cycle in 1 month and headache, has been on antibiotic and recent stress

## 2013-02-18 LAB — GC/CHLAMYDIA PROBE AMP: CT Probe RNA: NEGATIVE

## 2013-03-26 ENCOUNTER — Encounter (HOSPITAL_COMMUNITY): Payer: Self-pay | Admitting: Emergency Medicine

## 2013-03-26 ENCOUNTER — Emergency Department (INDEPENDENT_AMBULATORY_CARE_PROVIDER_SITE_OTHER)
Admission: EM | Admit: 2013-03-26 | Discharge: 2013-03-26 | Disposition: A | Discharge status: Home / Self Care | Payer: Medicaid Other | Source: Home / Self Care | Attending: Family Medicine | Admitting: Family Medicine

## 2013-03-26 DIAGNOSIS — L723 Sebaceous cyst: Secondary | ICD-10-CM

## 2013-03-26 MED ORDER — NEOMYCIN-POLYMYXIN-HC 3.5-10000-1 OT SUSP: 4.0000 [drp] | Freq: Three times a day (TID) | OTIC | Status: DC

## 2013-03-26 NOTE — ED Provider Notes (Signed)
CSN: [id number]     Arrival date & time 03/26/13  1245 History   First MD Initiated Contact with Patient 03/26/13 1413     Chief Complaint  Patient presents with  . Otalgia   (Consider location/radiation/quality/duration/timing/severity/associated sxs/prior Treatment) Patient is a 27 y.o. female presenting with ear pain. The history is provided by the patient.  Otalgia Location:  Right Behind ear:  No abnormality Quality:  Sore Severity:  Mild Onset quality:  Gradual Duration:  2 days Progression:  Worsening Chronicity:  New Context comment:  Swelling of opening of canal , spont drained this am with warm water. Associated symptoms: ear discharge   Associated symptoms: no fever, no hearing loss, no rhinorrhea and no sore throat     History reviewed. No pertinent past medical history. History reviewed. No pertinent past surgical history. History reviewed. No pertinent family history. History  Substance Use Topics  . Smoking status: Former Games developer  . Smokeless tobacco: Not on file  . Alcohol Use: Yes   OB History   Grav Para Term Preterm Abortions TAB SAB Ect Mult Living   1 1  1      1      Review of Systems  Constitutional: Negative.  Negative for fever.  HENT: Positive for ear discharge and ear pain. Negative for facial swelling, hearing loss, postnasal drip, rhinorrhea and sore throat.     Allergies  Review of patient's allergies indicates no known allergies.  Home Medications   Current Outpatient Rx  Name  Route  Sig  Dispense  Refill  . amoxicillin (AMOXIL) 500 MG capsule   Oral   Take 1 capsule (500 mg total) by mouth 3 (three) times daily.   60 capsule   0   . ibuprofen (ADVIL,MOTRIN) 200 MG tablet   Oral   Take 600 mg by mouth every 6 (six) hours as needed for pain.         Marland Kitchen neomycin-polymyxin-hydrocortisone (CORTISPORIN) 3.5-10000-1 otic suspension   Right Ear   Place 4 drops into the right ear 3 (three) times daily.   10 mL   0    BP 109/75   Pulse 88  Temp(Src) 98.7 F (37.1 C) (Oral)  Resp 16  SpO2 97% Physical Exam  Nursing note and vitals reviewed. Constitutional: She appears well-developed and well-nourished.  HENT:  Head: Normocephalic.  Right Ear: Hearing and tympanic membrane normal. There is drainage, swelling and tenderness. Tympanic membrane is not injected, not scarred, not perforated, not erythematous and not bulging.  Left Ear: Tympanic membrane, external ear and ear canal normal.  Ears:  Mouth/Throat: Oropharynx is clear and moist.    ED Course  Procedures (including critical care time) Labs Review Labs Reviewed - No data to display Imaging Review No results found.  MDM      Linna Hoff, MD 03/26/13 [phone]

## 2013-03-26 NOTE — ED Notes (Signed)
C/o pain in right ear x 2 days; "I think it's a pimple in my ear, busted and was draining" denies loss of hearing

## 2013-12-25 ENCOUNTER — Emergency Department (HOSPITAL_COMMUNITY)
Admission: EM | Admit: 2013-12-25 | Discharge: 2013-12-25 | Disposition: A | Discharge status: Home / Self Care | Payer: Medicaid Other | Attending: Emergency Medicine | Admitting: Emergency Medicine

## 2013-12-25 ENCOUNTER — Encounter (HOSPITAL_COMMUNITY): Payer: Self-pay | Admitting: Emergency Medicine

## 2013-12-25 DIAGNOSIS — N938 Other specified abnormal uterine and vaginal bleeding: Secondary | ICD-10-CM | POA: Insufficient documentation

## 2013-12-25 DIAGNOSIS — N939 Abnormal uterine and vaginal bleeding, unspecified: Secondary | ICD-10-CM

## 2013-12-25 DIAGNOSIS — Z87891 Personal history of nicotine dependence: Secondary | ICD-10-CM | POA: Insufficient documentation

## 2013-12-25 DIAGNOSIS — N949 Unspecified condition associated with female genital organs and menstrual cycle: Secondary | ICD-10-CM | POA: Insufficient documentation

## 2013-12-25 DIAGNOSIS — N925 Other specified irregular menstruation: Secondary | ICD-10-CM | POA: Insufficient documentation

## 2013-12-25 DIAGNOSIS — Z3202 Encounter for pregnancy test, result negative: Secondary | ICD-10-CM | POA: Insufficient documentation

## 2013-12-25 LAB — URINALYSIS, ROUTINE W REFLEX MICROSCOPIC
Bilirubin Urine: NEGATIVE
GLUCOSE, UA: NEGATIVE mg/dL
Ketones, ur: NEGATIVE mg/dL
LEUKOCYTES UA: NEGATIVE
Nitrite: NEGATIVE
PH: 5.5 (ref 5.0–8.0)
Protein, ur: NEGATIVE mg/dL
SPECIFIC GRAVITY, URINE: 1.024 (ref 1.005–1.030)
Urobilinogen, UA: 0.2 mg/dL (ref 0.0–1.0)

## 2013-12-25 LAB — WET PREP, GENITAL
TRICH WET PREP: NONE SEEN
YEAST WET PREP: NONE SEEN

## 2013-12-25 LAB — CBC WITH DIFFERENTIAL/PLATELET
BASOS PCT: 0 % (ref 0–1)
Basophils Absolute: 0 10*3/uL (ref 0.0–0.1)
EOS ABS: 0.2 10*3/uL (ref 0.0–0.7)
EOS PCT: 5 % (ref 0–5)
HCT: 32.3 % — ABNORMAL LOW (ref 36.0–46.0)
Hemoglobin: 11.3 g/dL — ABNORMAL LOW (ref 12.0–15.0)
Lymphocytes Relative: 52 % — ABNORMAL HIGH (ref 12–46)
Lymphs Abs: 1.7 10*3/uL (ref 0.7–4.0)
MCH: 31 pg (ref 26.0–34.0)
MCHC: 35 g/dL (ref 30.0–36.0)
MCV: 88.5 fL (ref 78.0–100.0)
Monocytes Absolute: 0.4 10*3/uL (ref 0.1–1.0)
Monocytes Relative: 12 % (ref 3–12)
NEUTROS PCT: 31 % — AB (ref 43–77)
Neutro Abs: 1 10*3/uL — ABNORMAL LOW (ref 1.7–7.7)
PLATELETS: 287 10*3/uL (ref 150–400)
RBC: 3.65 MIL/uL — ABNORMAL LOW (ref 3.87–5.11)
RDW: 12.8 % (ref 11.5–15.5)
WBC: 3.2 10*3/uL — ABNORMAL LOW (ref 4.0–10.5)

## 2013-12-25 LAB — POC URINE PREG, ED: Preg Test, Ur: NEGATIVE

## 2013-12-25 LAB — URINE MICROSCOPIC-ADD ON

## 2013-12-25 MED ORDER — NAPROXEN 250 MG PO TABS
500.0000 mg | ORAL_TABLET | Freq: Once | ORAL | Status: AC
  Administered 2013-12-25: 500 mg via ORAL
  Filled 2013-12-25: qty 2

## 2013-12-25 MED ORDER — NAPROXEN SODIUM 220 MG PO TABS: 220.0000 mg | ORAL_TABLET | Freq: Two times a day (BID) | ORAL | Status: DC

## 2013-12-25 NOTE — ED Notes (Signed)
PT in bed with covers over head; states she does not have to void yet. RN reinforced need for sample.

## 2013-12-25 NOTE — ED Notes (Signed)
PT instructed to get undressed from bottom down for pelvic preparation. Pelvic cart at bedside.

## 2013-12-25 NOTE — ED Notes (Signed)
Per pt sts that for the past 2 months she has been having irregular periods and twice a month. sts on her menstrual now. sts abdominal cramping.

## 2013-12-25 NOTE — Discharge Instructions (Signed)
Abnormal Uterine Bleeding Abnormal uterine bleeding can affect women at various stages in life, including teenagers, women in their reproductive years, pregnant women, and women who have reached menopause. Several kinds of uterine bleeding are considered abnormal, including:  Bleeding or spotting between periods.   Bleeding after sexual intercourse.   Bleeding that is heavier or more than normal.   Periods that last longer than usual.  Bleeding after menopause.  Many cases of abnormal uterine bleeding are minor and simple to treat, while others are more serious. Any type of abnormal bleeding should be evaluated by your health care provider. Treatment will depend on the cause of the bleeding. HOME CARE INSTRUCTIONS Monitor your condition for any changes. The following actions may help to alleviate any discomfort you are experiencing:  Avoid the use of tampons and douches as directed by your health care provider.  Change your pads frequently. You should get regular pelvic exams and Pap tests. Keep all follow-up appointments for diagnostic tests as directed by your health care provider.  SEEK MEDICAL CARE IF:   Your bleeding lasts more than 1 week.   You feel dizzy at times.  SEEK IMMEDIATE MEDICAL CARE IF:   You pass out.   You are changing pads every 15 to 30 minutes.   You have abdominal pain.  You have a fever.   You become sweaty or weak.   You are passing large blood clots from the vagina.   You start to feel nauseous and vomit. MAKE SURE YOU:   Understand these instructions.  Will watch your condition.  Will get help right away if you are not doing well or get worse. Document Released: 06/05/2005 Document Revised: 06/10/2013 Document Reviewed: 01/02/2013 Exit[employer] Patient Information 2015 [employer]. This information is not intended to replace advice given to you by your health care provider. Make sure you discuss any questions you have with your  health care provider.  

## 2013-12-25 NOTE — ED Provider Notes (Signed)
CSN: [id number]     Arrival date & time 12/25/13  [id number] History   First MD Initiated Contact with Patient 12/25/13 [phone]     Chief Complaint  Patient presents with  . Menstrual Problem     (Consider location/radiation/quality/duration/timing/severity/associated sxs/prior Treatment) HPI  28 year old G1P1 female with no significant past medical history presents for evaluation of abnormal menstruation. Patient reports for the past 2 months she has had irregular periods. Patient reports having 2 periods a month usually lasting for about 3 or 4 days that start with mild vaginal spotting and subsequently needs to focus her period. This is unusual for her. Patient reports having increasing back pain for the past 6 days. Pain dysmenorrhea previously with the last menstrual period Approximately a week ago.  Did report having some vaginal discharge recently.  Having colicky pain, mild headache.  No prior hx of STD.  Denies fever, chills, chest pain, shortness of breath, productive cough, dysuria.  Denies any new sexual partners.  History reviewed. No pertinent past medical history. History reviewed. No pertinent past surgical history. History reviewed. No pertinent family history. History  Substance Use Topics  . Smoking status: Former Games developermoker  . Smokeless tobacco: Not on file  . Alcohol Use: Yes   OB History   Grav Para Term Preterm Abortions TAB SAB Ect Mult Living   1 1  1      1      Review of Systems  All other systems reviewed and are negative.     Allergies  Review of patient's allergies indicates no known allergies.  Home Medications   Prior to Admission medications   Medication Sig Start Date End Date Taking? Authorizing Provider  ibuprofen (ADVIL,MOTRIN) 200 MG tablet Take 600 mg by mouth every 6 (six) hours as needed for pain.   Yes Historical Provider, MD   BP 112/70  Pulse 60  Temp(Src) 98.4 F (36.9 C) (Oral)  Resp 14  Wt 130 lb (58.968 kg)  SpO2 100%  LMP  12/25/2013 Physical Exam  Nursing note and vitals reviewed. Constitutional: She appears well-developed and well-nourished. No distress.  HENT:  Head: Normocephalic and atraumatic.  Eyes: Conjunctivae are normal.  Neck: Normal range of motion. Neck supple.  Cardiovascular: Normal rate and regular rhythm.   Pulmonary/Chest: Effort normal and breath sounds normal. She exhibits no tenderness.  Abdominal: Soft. Bowel sounds are normal. She exhibits no distension. There is no tenderness. There is no rebound and no guarding.  Genitourinary: Uterus normal. There is no rash or lesion on the right labia. There is no rash or lesion on the left labia. Cervix exhibits no motion tenderness and no discharge. Right adnexum displays no mass and no tenderness. Left adnexum displays no mass and no tenderness. There is bleeding (scant amount of blood noted in vaginal vault.) around the vagina. No erythema or tenderness around the vagina. No vaginal discharge found.  Chaperone present:  Lymphadenopathy:       Right: No inguinal adenopathy present.       Left: No inguinal adenopathy present.    ED Course  Procedures (including critical care time)  11:11 AM Pt with irregular menstruation.  Pregnancy test neg, UA neg.  No significant anemia noted, wet prep unremarkable, no CMT.  Recommend f/u with OBGYN for abnormal uterine bleeding.  NSAIDs prescribed for pain.  Return precaution discussed. Doubt acute emergent condition.  Pt stable for discharge.    Labs Review Labs Reviewed  WET PREP, GENITAL - Abnormal; Notable for the  following:    Clue Cells Wet Prep HPF POC FEW (*)    WBC, Wet Prep HPF POC FEW (*)    All other components within normal limits  URINALYSIS, ROUTINE W REFLEX MICROSCOPIC - Abnormal; Notable for the following:    Hgb urine dipstick TRACE (*)    All other components within normal limits  CBC WITH DIFFERENTIAL - Abnormal; Notable for the following:    WBC 3.2 (*)    RBC 3.65 (*)     Hemoglobin 11.3 (*)    HCT 32.3 (*)    Neutrophils Relative % 31 (*)    Neutro Abs 1.0 (*)    Lymphocytes Relative 52 (*)    All other components within normal limits  GC/CHLAMYDIA PROBE AMP  URINE MICROSCOPIC-ADD ON  POC URINE PREG, ED    Imaging Review No results found.   EKG Interpretation None      MDM   Final diagnoses:  Abnormal uterine bleeding    BP 100/48  Pulse 59  Temp(Src) 98.4 F (36.9 C) (Oral)  Resp 18  Wt 130 lb (58.968 kg)  SpO2 99%  LMP 12/25/2013     Fayrene Helper, PA-C 12/25/13 1251  Fayrene Helper, PA-C 12/25/13 1252

## 2013-12-25 NOTE — ED Provider Notes (Signed)
Medical screening examination/treatment/procedure(s) were performed by non-physician practitioner and as supervising physician I was immediately available for consultation/collaboration.   EKG Interpretation None        Cynara Tatham S [place], MD 12/25/13 2015 

## 2013-12-25 NOTE — ED Notes (Signed)
PT refused wheelchair. Took belongings (mobile and clothes with her)

## 2013-12-26 LAB — GC/CHLAMYDIA PROBE AMP
CT Probe RNA: NEGATIVE
GC PROBE AMP APTIMA: NEGATIVE

## 2014-04-20 ENCOUNTER — Encounter (HOSPITAL_COMMUNITY): Payer: Self-pay | Admitting: Emergency Medicine

## 2015-04-12 ENCOUNTER — Encounter (HOSPITAL_COMMUNITY): Payer: Self-pay | Admitting: Emergency Medicine

## 2015-04-12 ENCOUNTER — Emergency Department (HOSPITAL_COMMUNITY)
Admission: EM | Admit: 2015-04-12 | Discharge: 2015-04-12 | Disposition: A | Discharge status: Home / Self Care | Payer: Medicaid Other | Attending: Emergency Medicine | Admitting: Emergency Medicine

## 2015-04-12 ENCOUNTER — Emergency Department (HOSPITAL_COMMUNITY): Payer: Medicaid Other

## 2015-04-12 DIAGNOSIS — Z87891 Personal history of nicotine dependence: Secondary | ICD-10-CM | POA: Insufficient documentation

## 2015-04-12 DIAGNOSIS — R05 Cough: Secondary | ICD-10-CM | POA: Diagnosis present

## 2015-04-12 DIAGNOSIS — J029 Acute pharyngitis, unspecified: Secondary | ICD-10-CM | POA: Insufficient documentation

## 2015-04-12 DIAGNOSIS — J209 Acute bronchitis, unspecified: Secondary | ICD-10-CM | POA: Diagnosis not present

## 2015-04-12 HISTORY — DX: Bronchitis, not specified as acute or chronic: J40

## 2015-04-12 MED ORDER — IBUPROFEN 200 MG PO TABS
ORAL_TABLET | ORAL | Status: AC
  Filled 2015-04-12: qty 3

## 2015-04-12 MED ORDER — BENZONATATE 100 MG PO CAPS
100.0000 mg | ORAL_CAPSULE | Freq: Once | ORAL | Status: AC
  Administered 2015-04-12: 100 mg via ORAL

## 2015-04-12 MED ORDER — IBUPROFEN 200 MG PO TABS
600.0000 mg | ORAL_TABLET | Freq: Once | ORAL | Status: AC
  Administered 2015-04-12: 600 mg via ORAL

## 2015-04-12 MED ORDER — BENZONATATE 100 MG PO CAPS
ORAL_CAPSULE | ORAL | Status: AC
  Filled 2015-04-12: qty 1

## 2015-04-12 MED ORDER — BENZONATATE 100 MG PO CAPS: 100.0000 mg | ORAL_CAPSULE | Freq: Three times a day (TID) | ORAL | Status: DC

## 2015-04-12 NOTE — ED Notes (Signed)
Per patient been having productive cough with clear phlegm and dizziness for two days.  Denies having any fever.

## 2015-04-12 NOTE — Discharge Instructions (Signed)

## 2015-04-12 NOTE — ED Provider Notes (Signed)
CSN: [id number]     Arrival date & time 04/12/15  0622 History   First MD Initiated Contact with Patient 04/12/15 [phone]     Chief Complaint  Patient presents with  . Cough     (Consider location/radiation/quality/duration/timing/severity/associated sxs/prior Treatment) Patient is a 29 y.o. female presenting with cough. The history is provided by the patient.  Cough Cough characteristics:  Productive Sputum characteristics:  Clear Severity:  Moderate Onset quality:  Gradual Duration:  2 days Timing:  Intermittent Progression:  Worsening Chronicity:  New Smoker: no   Context: not sick contacts   Relieved by:  Nothing Worsened by:  Nothing tried Ineffective treatments: tea, honey, soup. Associated symptoms: chest pain, rhinorrhea, shortness of breath, sore throat and wheezing   Associated symptoms: no chills, no ear pain, no fever, no headaches and no myalgias       Past Medical History  Diagnosis Date  . Bronchitis    History reviewed. No pertinent past surgical history. No family history on file. Social History  Substance Use Topics  . Smoking status: Former Games developer  . Smokeless tobacco: None  . Alcohol Use: Yes     Comment: occasionaly   OB History    Gravida Para Term Preterm AB TAB SAB Ectopic Multiple Living   Review of Systems  Constitutional: Negative for fever and chills.  HENT: Positive for rhinorrhea and sore throat. Negative for ear pain.   Respiratory: Positive for cough, shortness of breath and wheezing.   Cardiovascular: Positive for chest pain. Negative for leg swelling.  Gastrointestinal: Negative for nausea, vomiting, abdominal pain, diarrhea and blood in stool.  Genitourinary: Negative for dysuria, urgency and hematuria.  Musculoskeletal: Negative for myalgias.  Neurological: Positive for dizziness. Negative for headaches.      Allergies  Review of patient's allergies indicates no known allergies.  Home Medications    Prior to Admission medications   Medication Sig Start Date End Date Taking? Authorizing Provider  benzonatate (TESSALON) 100 MG capsule Take 1 capsule (100 mg total) by mouth 3 (three) times daily. 04/12/15   Cheri Fowler, PA-C  naproxen sodium (ANAPROX) 220 MG tablet Take 1 tablet (220 mg total) by mouth 2 (two) times daily with a meal. Patient not taking: Reported on 04/12/2015 12/25/13   Fayrene Helper, PA-C   BP 111/65 mmHg  Pulse 70  Temp(Src) 98 F (36.7 C) (Oral)  Resp 17  Ht  (1.626 m)  Wt 123 lb (55.792 kg)  BMI 21.10 kg/m2  SpO2 100%  LMP 03/29/2015 Physical Exam  Constitutional: She is oriented to person, place, and time. She appears well-developed and well-nourished.  HENT:  Head: Normocephalic and atraumatic.  Right Ear: Tympanic membrane normal.  Left Ear: Tympanic membrane normal.  Nose: Rhinorrhea present.  Mouth/Throat: Uvula is midline and oropharynx is clear and moist. No trismus in the jaw. No uvula swelling. No oropharyngeal exudate, posterior oropharyngeal edema or posterior oropharyngeal erythema.  Eyes: Conjunctivae are normal. Pupils are equal, round, and reactive to light.  Neck: Normal range of motion. Neck supple.  Cardiovascular: Normal rate, regular rhythm and normal heart sounds.   No murmur heard. Pulmonary/Chest: Effort normal and breath sounds normal. No accessory muscle usage or stridor. No respiratory distress. She has no wheezes. She has no rhonchi. She has no rales.  Abdominal: Soft. Bowel sounds are normal. She exhibits no distension. There is no tenderness.  Musculoskeletal: Normal range of motion.  Lymphadenopathy:    She has no cervical adenopathy.  Neurological: She is alert and oriented to person, place, and time.  Speech clear without dysarthria.  Skin: Skin is warm and dry.  Psychiatric: She has a normal mood and affect. Her behavior is normal.    ED Course  Procedures (including critical care time) Labs Review Labs Reviewed -  No data to display  Imaging Review No results found. I have personally reviewed and evaluated these images and lab results as part of my medical decision-making.   EKG Interpretation None      MDM   Final diagnoses:  Acute bronchitis, unspecified organism    Patient presents with 2 day history of worsening cough.  Assoc sxs include rhinorrhea, wheezing, chest pain with coughing, sore throat.  No fevers.  No neck stiffness.  Symptoms consistent with acute bronchitis.  Will obtain CXR to r/o pneumonia.  Doubt PE.  No hx of DVT/PE, leg swelling, recent immobilization.  CXR shows no signs of PNA. Patient stable for discharge. Discussed return precautions.  Patient agrees and acknowledges the above plan for discharge.     Cheri FowlerKayla Thurman Sarver, PA-C 04/12/15 [id number]  Tilden FossaElizabeth Rees, MD 04/13/15 [phone]

## 2015-04-12 NOTE — ED Notes (Signed)
Taken to xray at this time. 

## 2015-05-31 ENCOUNTER — Emergency Department (HOSPITAL_COMMUNITY)
Admission: EM | Admit: 2015-05-31 | Discharge: 2015-05-31 | Disposition: A | Discharge status: Home / Self Care | Payer: Medicaid Other | Attending: Emergency Medicine | Admitting: Emergency Medicine

## 2015-05-31 ENCOUNTER — Emergency Department (HOSPITAL_COMMUNITY): Payer: Medicaid Other

## 2015-05-31 ENCOUNTER — Encounter (HOSPITAL_COMMUNITY): Payer: Self-pay | Admitting: Emergency Medicine

## 2015-05-31 DIAGNOSIS — R519 Headache, unspecified: Secondary | ICD-10-CM

## 2015-05-31 DIAGNOSIS — R112 Nausea with vomiting, unspecified: Secondary | ICD-10-CM | POA: Insufficient documentation

## 2015-05-31 DIAGNOSIS — Z8709 Personal history of other diseases of the respiratory system: Secondary | ICD-10-CM | POA: Diagnosis not present

## 2015-05-31 DIAGNOSIS — Z87891 Personal history of nicotine dependence: Secondary | ICD-10-CM | POA: Insufficient documentation

## 2015-05-31 DIAGNOSIS — Z79899 Other long term (current) drug therapy: Secondary | ICD-10-CM | POA: Insufficient documentation

## 2015-05-31 DIAGNOSIS — Z3202 Encounter for pregnancy test, result negative: Secondary | ICD-10-CM | POA: Insufficient documentation

## 2015-05-31 DIAGNOSIS — R51 Headache: Secondary | ICD-10-CM | POA: Diagnosis present

## 2015-05-31 DIAGNOSIS — H53149 Visual discomfort, unspecified: Secondary | ICD-10-CM | POA: Insufficient documentation

## 2015-05-31 LAB — CBC WITH DIFFERENTIAL/PLATELET
Basophils Absolute: 0 10*3/uL (ref 0.0–0.1)
Basophils Relative: 0 %
EOS PCT: 2 %
Eosinophils Absolute: 0.1 10*3/uL (ref 0.0–0.7)
HCT: 34.3 % — ABNORMAL LOW (ref 36.0–46.0)
HEMOGLOBIN: 11.6 g/dL — AB (ref 12.0–15.0)
LYMPHS ABS: 1.8 10*3/uL (ref 0.7–4.0)
LYMPHS PCT: 43 %
MCH: 31 pg (ref 26.0–34.0)
MCHC: 33.8 g/dL (ref 30.0–36.0)
MCV: 91.7 fL (ref 78.0–100.0)
MONOS PCT: 7 %
Monocytes Absolute: 0.3 10*3/uL (ref 0.1–1.0)
NEUTROS PCT: 48 %
Neutro Abs: 2 10*3/uL (ref 1.7–7.7)
Platelets: 276 10*3/uL (ref 150–400)
RBC: 3.74 MIL/uL — AB (ref 3.87–5.11)
RDW: 13.2 % (ref 11.5–15.5)
WBC: 4.2 10*3/uL (ref 4.0–10.5)

## 2015-05-31 LAB — I-STAT CHEM 8, ED
BUN: 10 mg/dL (ref 6–20)
CALCIUM ION: 1.2 mmol/L (ref 1.12–1.23)
CHLORIDE: 106 mmol/L (ref 101–111)
Creatinine, Ser: 0.7 mg/dL (ref 0.44–1.00)
GLUCOSE: 89 mg/dL (ref 65–99)
HCT: 36 % (ref 36.0–46.0)
Hemoglobin: 12.2 g/dL (ref 12.0–15.0)
Potassium: 4.2 mmol/L (ref 3.5–5.1)
Sodium: 141 mmol/L (ref 135–145)
TCO2: 24 mmol/L (ref 0–100)

## 2015-05-31 LAB — CARBOXYHEMOGLOBIN
Carboxyhemoglobin: 2.6 % — ABNORMAL HIGH (ref 0.5–1.5)
Methemoglobin: 0.8 % (ref 0.0–1.5)
O2 Saturation: 93.7 %
TOTAL HEMOGLOBIN: 11.8 g/dL — AB (ref 12.0–16.0)

## 2015-05-31 LAB — I-STAT BETA HCG BLOOD, ED (MC, WL, AP ONLY): I-stat hCG, quantitative: <5 m[IU]/mL (ref <5)

## 2015-05-31 MED ORDER — DIPHENHYDRAMINE HCL 50 MG/ML IJ SOLN
25.0000 mg | Freq: Once | INTRAMUSCULAR | Status: AC
  Administered 2015-05-31: 25 mg via INTRAVENOUS
  Filled 2015-05-31: qty 1

## 2015-05-31 MED ORDER — ACETAMINOPHEN 325 MG PO TABS
650.0000 mg | ORAL_TABLET | Freq: Once | ORAL | Status: AC
  Administered 2015-05-31: 650 mg via ORAL
  Filled 2015-05-31: qty 2

## 2015-05-31 MED ORDER — MORPHINE SULFATE (PF) 4 MG/ML IV SOLN
4.0000 mg | Freq: Once | INTRAVENOUS | Status: DC
Start: 2015-05-31 — End: 2015-05-31

## 2015-05-31 MED ORDER — DEXAMETHASONE SODIUM PHOSPHATE 10 MG/ML IJ SOLN
10.0000 mg | Freq: Once | INTRAMUSCULAR | Status: AC
  Administered 2015-05-31: 10 mg via INTRAVENOUS
  Filled 2015-05-31: qty 1

## 2015-05-31 MED ORDER — METOCLOPRAMIDE HCL 5 MG/ML IJ SOLN
10.0000 mg | Freq: Once | INTRAMUSCULAR | Status: AC
  Administered 2015-05-31: 10 mg via INTRAVENOUS
  Filled 2015-05-31: qty 2

## 2015-05-31 MED ORDER — KETOROLAC TROMETHAMINE 30 MG/ML IJ SOLN
30.0000 mg | Freq: Once | INTRAMUSCULAR | Status: AC
  Administered 2015-05-31: 30 mg via INTRAVENOUS
  Filled 2015-05-31: qty 1

## 2015-05-31 NOTE — ED Notes (Signed)
Pt drinking fluids without nausea. Sitting up, headache pain 2/10.

## 2015-05-31 NOTE — Discharge Instructions (Signed)
Take Tylenol, or Excedrin Migraine, or ibuprofen for your headache. Drink plenty of fluids. Get good rest. Follow-up with primary care doctor.  General Headache Without Cause A headache is pain or discomfort felt around the head or neck area. The specific cause of a headache may not be found. There are many causes and types of headaches. A few common ones are:  Tension headaches.  Migraine headaches.  Cluster headaches.  Chronic daily headaches. HOME CARE INSTRUCTIONS  Watch your condition for any changes. Take these steps to help with your condition: Managing Pain  Take over-the-counter and prescription medicines only as told by your health care provider.  Lie down in a dark, quiet room when you have a headache.  If directed, apply ice to the head and neck area:  Put ice in a plastic bag.  Place a towel between your skin and the bag.  Leave the ice on for 20 minutes, 2-3 times per day.  Use a heating pad or hot shower to apply heat to the head and neck area as told by your health care provider.  Keep lights dim if bright lights bother you or make your headaches worse. Eating and Drinking  Eat meals on a regular schedule.  Limit alcohol use.  Decrease the amount of caffeine you drink, or stop drinking caffeine. General Instructions  Keep all follow-up visits as told by your health care provider. This is important.  Keep a headache journal to help find out what may trigger your headaches. For example, write down:  What you eat and drink.  How much sleep you get.  Any change to your diet or medicines.  Try massage or other relaxation techniques.  Limit stress.  Sit up straight, and do not tense your muscles.  Do not use tobacco products, including cigarettes, chewing tobacco, or e-cigarettes. If you need help quitting, ask your health care provider.  Exercise regularly as told by your health care provider.  Sleep on a regular schedule. Get 7-9 hours of sleep,  or the amount recommended by your health care provider. SEEK MEDICAL CARE IF:   Your symptoms are not helped by medicine.  You have a headache that is different from the usual headache.  You have nausea or you vomit.  You have a fever. SEEK IMMEDIATE MEDICAL CARE IF:   Your headache becomes severe.  You have repeated vomiting.  You have a stiff neck.  You have a loss of vision.  You have problems with speech.  You have pain in the eye or ear.  You have muscular weakness or loss of muscle control.  You lose your balance or have trouble walking.  You feel faint or pass out.  You have confusion.   This information is not intended to replace advice given to you by your health care provider. Make sure you discuss any questions you have with your health care provider.   Document Released: 06/05/2005 Document Revised: 02/24/2015 Document Reviewed: 09/28/2014 Elsevier Interactive Patient Education [employer]2016 Elsevier Inc.

## 2015-05-31 NOTE — ED Provider Notes (Signed)
CSN: [id number]     Arrival date & time 05/31/15  4098 History   First MD Initiated Contact with Patient 05/31/15 0747     Chief Complaint  Patient presents with  . Headache     (Consider location/radiation/quality/duration/timing/severity/associated sxs/prior Treatment) HPI Linda Mcmahon is a 29 y.o. female with no medical problems, presents to ED with complaint of a headache.  Pt states she has hx of headaches that normally come on during her menses. States she is not currently on her menstrual cycle. States headaches come and go, but now more frequently. States she are almost daily. States worst pain is at night and in the morning. Reports daily headache for the last 5 days. Denies visual changes. No numbness or weakness in extremities. No fever. No neck pain. Reports episode of emesis this morning. State takes ibuprofen and tylenol normally, denies taking any this morning.    Past Medical History  Diagnosis Date  . Bronchitis    History reviewed. No pertinent past surgical history. No family history on file. Social History  Substance Use Topics  . Smoking status: Former Games developer  . Smokeless tobacco: None  . Alcohol Use: Yes     Comment: occasionaly   OB History    Gravida Para Term Preterm AB TAB SAB Ectopic Multiple Living   Review of Systems  Constitutional: Negative for fever and chills.  Eyes: Positive for photophobia.  Respiratory: Negative for cough, chest tightness and shortness of breath.   Cardiovascular: Negative for chest pain, palpitations and leg swelling.  Gastrointestinal: Positive for nausea and vomiting. Negative for abdominal pain and diarrhea.  Genitourinary: Negative for dysuria, flank pain, vaginal discharge and pelvic pain.  Musculoskeletal: Negative for myalgias, arthralgias, neck pain and neck stiffness.  Skin: Negative for rash.  Neurological: Positive for headaches. Negative for dizziness and weakness.  All other systems  reviewed and are negative.     Allergies  Review of patient's allergies indicates no known allergies.  Home Medications   Prior to Admission medications   Medication Sig Start Date End Date Taking? Authorizing Provider  benzonatate (TESSALON) 100 MG capsule Take 1 capsule (100 mg total) by mouth 3 (three) times daily. 04/12/15   Cheri Fowler, PA-C  naproxen sodium (ANAPROX) 220 MG tablet Take 1 tablet (220 mg total) by mouth 2 (two) times daily with a meal. Patient not taking: Reported on 04/12/2015 12/25/13   Fayrene Helper, PA-C   BP 108/73 mmHg  Pulse 62  Temp(Src) 98.2 F (36.8 C) (Oral)  Resp 18  Ht  (1.651 m)  Wt 54.885 kg  BMI 20.14 kg/m2  SpO2 100%  LMP 05/24/2015 (Exact Date) Physical Exam  Constitutional: She is oriented to person, place, and time. She appears well-developed and well-nourished. No distress.  HENT:  Head: Normocephalic.  Eyes: Conjunctivae and EOM are normal. Pupils are equal, round, and reactive to light.  Neck: Normal range of motion. Neck supple.  No meningismus  Cardiovascular: Normal rate, regular rhythm and normal heart sounds.   Pulmonary/Chest: Effort normal and breath sounds normal. No respiratory distress. She has no wheezes. She has no rales.  Abdominal: Soft. Bowel sounds are normal. She exhibits no distension. There is no tenderness. There is no rebound.  Musculoskeletal: She exhibits no edema.  Neurological: She is alert and oriented to person, place, and time. No cranial nerve deficit. Coordination normal.  5/5 and equal upper and lower extremity  strength bilaterally. Equal grip strength bilaterally. Normal finger to nose and heel to shin. No pronator drift.   Skin: Skin is warm and dry.  Psychiatric: She has a normal mood and affect. Her behavior is normal.  Nursing note and vitals reviewed.   ED Course  Procedures (including critical care time) Labs Review Labs Reviewed  CARBOXYHEMOGLOBIN - Abnormal; Notable for the following:     Total hemoglobin 11.8 (*)    Carboxyhemoglobin 2.6 (*)    All other components within normal limits  CBC WITH DIFFERENTIAL/PLATELET - Abnormal; Notable for the following:    RBC 3.74 (*)    Hemoglobin 11.6 (*)    HCT 34.3 (*)    All other components within normal limits  I-STAT CHEM 8, ED  I-STAT BETA HCG BLOOD, ED ([facility], [facility], [facility] ONLY)    Imaging Review Ct Head Wo Contrast  05/31/2015  CLINICAL DATA:  Headache and vomiting this morning. EXAM: CT HEAD WITHOUT CONTRAST TECHNIQUE: Contiguous axial images were obtained from the base of the skull through the vertex without intravenous contrast. COMPARISON:  None. FINDINGS: No mass lesion. No midline shift. No acute hemorrhage or hematoma. No extra-axial fluid collections. No evidence of acute infarction. Brain parenchyma is normal. Osseous structures are normal. IMPRESSION: Normal exam. Electronically Signed   By: Francene BoyersJames  Maxwell M.D.   On: 05/31/2015 10:49   I have personally reviewed and evaluated these images and lab results as part of my medical decision-making.   EKG Interpretation None      MDM   Final diagnoses:  Headache    Pt with worsening headaches, no injury, no fever. Will get labs and carboxyhemoglobin level to ro carbon monoxide poisoning, H&H to rule out anemia, electrolytes. VS normal. Will try migraine cocktail.    Labs and Carboxyhemoglobin negative. CT obtained and is negative. Headache is down to 2/10 with toradol, benadryl, decadron, reglan. Pt tolerating PO fluids. Will dc home. Follow up with pcp.   Filed Vitals:   05/31/15 1038 05/31/15 1100 05/31/15 1140 05/31/15 1147  BP: 88/59 84/63 105/68 125/52  Pulse: 58 57 66 66  Temp:      TempSrc:      Resp: 14  16 16   Height:      Weight:      SpO2: 99% 99% 100% 100%     Jaynie Crumbleatyana Andilynn Delavega, PA-C 05/31/15 1435  Blane OharaJoshua Zavitz, MD 05/31/15 1544

## 2015-05-31 NOTE — ED Notes (Signed)
Headache started 5 days ago--  No hx of migraines, but states does get headaches every month with period. Has already had period this month. This headache is different per pt. Vomited this am.

## 2015-07-15 ENCOUNTER — Encounter (HOSPITAL_COMMUNITY): Payer: Self-pay

## 2015-07-15 ENCOUNTER — Inpatient Hospital Stay (HOSPITAL_COMMUNITY)
Admission: AD | Admit: 2015-07-15 | Discharge: 2015-07-15 | Disposition: A | Discharge status: Home / Self Care | Payer: Medicaid Other | Source: Ambulatory Visit | Attending: Obstetrics & Gynecology | Admitting: Obstetrics & Gynecology

## 2015-07-15 DIAGNOSIS — N760 Acute vaginitis: Secondary | ICD-10-CM

## 2015-07-15 DIAGNOSIS — N926 Irregular menstruation, unspecified: Secondary | ICD-10-CM | POA: Diagnosis not present

## 2015-07-15 DIAGNOSIS — N939 Abnormal uterine and vaginal bleeding, unspecified: Secondary | ICD-10-CM | POA: Insufficient documentation

## 2015-07-15 DIAGNOSIS — Z87891 Personal history of nicotine dependence: Secondary | ICD-10-CM | POA: Insufficient documentation

## 2015-07-15 DIAGNOSIS — B9689 Other specified bacterial agents as the cause of diseases classified elsewhere: Secondary | ICD-10-CM

## 2015-07-15 DIAGNOSIS — A499 Bacterial infection, unspecified: Secondary | ICD-10-CM

## 2015-07-15 DIAGNOSIS — N63 Unspecified lump in breast: Secondary | ICD-10-CM | POA: Insufficient documentation

## 2015-07-15 DIAGNOSIS — N6310 Unspecified lump in the right breast, unspecified quadrant: Secondary | ICD-10-CM

## 2015-07-15 HISTORY — DX: Other specified health status: Z78.9

## 2015-07-15 LAB — URINALYSIS, ROUTINE W REFLEX MICROSCOPIC
Bilirubin Urine: NEGATIVE
GLUCOSE, UA: NEGATIVE mg/dL
Ketones, ur: NEGATIVE mg/dL
LEUKOCYTES UA: NEGATIVE
NITRITE: NEGATIVE
PROTEIN: NEGATIVE mg/dL
Specific Gravity, Urine: 1.025 (ref 1.005–1.030)
pH: 6 (ref 5.0–8.0)

## 2015-07-15 LAB — WET PREP, GENITAL
Sperm: NONE SEEN
Trich, Wet Prep: NONE SEEN
Yeast Wet Prep HPF POC: NONE SEEN

## 2015-07-15 LAB — URINE MICROSCOPIC-ADD ON
BACTERIA UA: NONE SEEN
WBC, UA: NONE SEEN WBC/hpf (ref 0–5)

## 2015-07-15 LAB — CBC
HCT: 35.3 % — ABNORMAL LOW (ref 36.0–46.0)
HEMOGLOBIN: 12.3 g/dL (ref 12.0–15.0)
MCH: 31.6 pg (ref 26.0–34.0)
MCHC: 34.8 g/dL (ref 30.0–36.0)
MCV: 90.7 fL (ref 78.0–100.0)
PLATELETS: 290 10*3/uL (ref 150–400)
RBC: 3.89 MIL/uL (ref 3.87–5.11)
RDW: 13.6 % (ref 11.5–15.5)
WBC: 4.1 10*3/uL (ref 4.0–10.5)

## 2015-07-15 LAB — POCT PREGNANCY, URINE: Preg Test, Ur: NEGATIVE

## 2015-07-15 MED ORDER — IBUPROFEN 800 MG PO TABS: 800.0000 mg | ORAL_TABLET | Freq: Three times a day (TID) | ORAL | Status: DC

## 2015-07-15 MED ORDER — KETOROLAC TROMETHAMINE 60 MG/2ML IM SOLN
60.0000 mg | Freq: Once | INTRAMUSCULAR | Status: AC
  Administered 2015-07-15: 60 mg via INTRAMUSCULAR
  Filled 2015-07-15: qty 2

## 2015-07-15 MED ORDER — METRONIDAZOLE 500 MG PO TABS: 500.0000 mg | ORAL_TABLET | Freq: Two times a day (BID) | ORAL | Status: DC

## 2015-07-15 MED ORDER — MEDROXYPROGESTERONE ACETATE 10 MG PO TABS: 10.0000 mg | ORAL_TABLET | Freq: Every day | ORAL | Status: DC

## 2015-07-15 NOTE — MAU Provider Note (Signed)
History     CSN: [id number]  Arrival date and time: 07/15/15 1220   First Provider Initiated Contact with Patient 07/15/15 1437      Chief Complaint  Patient presents with  . Vaginal Bleeding   HPI   Ms.Linda Mcmahon is a 30 y.o. female G1P0101  presenting to MAU with abnormal vaginal bleeding and concerns about a right breast mass.  Her last menstrual cycle was the first of January; this was a normal cycle for her.  She had intercourse in the middle of the month and then started bleeding after that. She has been bleeding every since.  The bleeding waxes and wanes from heavy to light.   She is currently bleeding, the bleeding has lightened up some. She has been having lower abdominal cramping since she started bleeding again.  She took tylenol this morning which has not been helping.   OB History    Gravida Para Term Preterm AB TAB SAB Ectopic Multiple Living   Past Medical History  Diagnosis Date  . Bronchitis   . Medical history non-contributory     Past Surgical History  Procedure Laterality Date  . No past surgeries      Family History  Problem Relation Age of Onset  . Cancer Maternal Grandmother     Social History  Substance Use Topics  . Smoking status: Former Games developer  . Smokeless tobacco: Never Used  . Alcohol Use: Yes     Comment: occasionaly    Allergies: No Known Allergies  Prescriptions prior to admission  Medication Sig Dispense Refill Last Dose  . [DISCONTINUED] naproxen sodium (ANAPROX) 220 MG tablet Take 1 tablet (220 mg total) by mouth 2 (two) times daily with a meal. (Patient not taking: Reported on 04/12/2015) 30 tablet 0 Not Taking at Unknown time   Results for orders placed or performed during the hospital encounter of 07/15/15 (from the past 48 hour(s))  Urinalysis, Routine w reflex microscopic (not at [facility])     Status: Abnormal   Collection Time: 07/15/15 12:53 PM  Result Value Ref Range   Color, Urine YELLOW YELLOW    APPearance CLEAR CLEAR   Specific Gravity, Urine 1.025 1.005 - 1.030   pH 6.0 5.0 - 8.0   Glucose, UA NEGATIVE NEGATIVE mg/dL   Hgb urine dipstick LARGE (A) NEGATIVE   Bilirubin Urine NEGATIVE NEGATIVE   Ketones, ur NEGATIVE NEGATIVE mg/dL   Protein, ur NEGATIVE NEGATIVE mg/dL   Nitrite NEGATIVE NEGATIVE   Leukocytes, UA NEGATIVE NEGATIVE  Urine microscopic-add on     Status: Abnormal   Collection Time: 07/15/15 12:53 PM  Result Value Ref Range   Squamous Epithelial / LPF 0-5 (A) NONE SEEN   WBC, UA NONE SEEN 0 - 5 WBC/hpf   RBC / HPF 0-5 0 - 5 RBC/hpf   Bacteria, UA NONE SEEN NONE SEEN   Urine-Other MUCOUS PRESENT   Pregnancy, urine POC     Status: None   Collection Time: 07/15/15  1:29 PM  Result Value Ref Range   Preg Test, Ur NEGATIVE NEGATIVE    Comment:        THE SENSITIVITY OF THIS METHODOLOGY IS >24 mIU/mL   CBC     Status: Abnormal   Collection Time: 07/15/15  2:28 PM  Result Value Ref Range   WBC 4.1 4.0 - 10.5 K/uL   RBC 3.89 3.87 - 5.11 MIL/uL   Hemoglobin  12.3 12.0 - 15.0 g/dL   HCT 40.9 (L) 81.1 - 91.4 %   MCV 90.7 78.0 - 100.0 fL   MCH 31.6 26.0 - 34.0 pg   MCHC 34.8 30.0 - 36.0 g/dL   RDW 78.2 95.6 - 21.3 %   Platelets 290 150 - 400 K/uL  Wet prep, genital     Status: Abnormal   Collection Time: 07/15/15  3:30 PM  Result Value Ref Range   Yeast Wet Prep HPF POC NONE SEEN NONE SEEN   Trich, Wet Prep NONE SEEN NONE SEEN   Clue Cells Wet Prep HPF POC PRESENT (A) NONE SEEN   WBC, Wet Prep HPF POC MODERATE (A) NONE SEEN    Comment: MANY BACTERIA SEEN   Sperm NONE SEEN     Review of Systems  Constitutional: Negative for fever and chills.  Gastrointestinal: Negative for nausea, vomiting, abdominal pain, diarrhea and constipation.   Physical Exam   Blood pressure 103/66, pulse 66, temperature 98.3 F (36.8 C), temperature source Oral, resp. rate 18, height 5' 5.5" (1.664 m), weight 121 lb 9.6 oz (55.157 kg), last menstrual period  06/30/2015.  Physical Exam  Constitutional: She is oriented to person, place, and time. She appears well-developed and well-nourished. No distress.  HENT:  Head: Normocephalic.  Eyes: Pupils are equal, round, and reactive to light.  Neck: Neck supple.  Respiratory: Effort normal. Right breast exhibits mass. Right breast exhibits no inverted nipple, no nipple discharge, no skin change and no tenderness. Breasts are symmetrical.    Palpable, pea side mass noted at 11 o'clock on right breast. Mass is mobile   GI: Soft. She exhibits no distension. There is no tenderness. There is no rebound and no guarding.  Genitourinary:  Speculum exam: Vagina - Small amount of dark red blood in the vagina.  Cervix - small amount of active bleeding.  Bimanual exam: Cervix closed, no CMT.  Uterus non tender, normal size Adnexa non tender, no masses bilaterally GC/Chlam, wet prep done Chaperone present for exam.  Musculoskeletal: Normal range of motion.  Neurological: She is alert and oriented to person, place, and time.  Skin: Skin is warm. She is not diaphoretic.  Psychiatric: Her behavior is normal.    MAU Course  Procedures  None  MDM  Toradol 60 mg IM; some relief.  Wet prep GC CBC normal   Assessment and Plan   A:  1. Breast mass, right   2. Abnormal menstrual cycle   3. BV (bacterial vaginosis)     P:  Discharge home in stable condition RX: Ibuprofen, Provera, Flagyl  Referral to the breast center  Follow up with the [facility]; call to make an appointment if needed Avoid alcohol with flagyl  Return to MAU for emergencies.    Duane Lope, NP 07/15/2015 6:47 PM

## 2015-07-15 NOTE — MAU Note (Signed)
Pt stated she had a period at the beginiging of the month and stopped. She starte d bleeding again  lightly at [facility] on 1/11 and it has progressed to being heavier. Changing pad every 30 min. Passing  A lot of clots.

## 2015-07-15 NOTE — Discharge Instructions (Signed)

## 2015-07-16 LAB — GC/CHLAMYDIA PROBE AMP (~~LOC~~) NOT AT ARMC
Chlamydia: NEGATIVE
Neisseria Gonorrhea: NEGATIVE

## 2015-07-29 ENCOUNTER — Other Ambulatory Visit: Payer: Medicaid Other

## 2015-10-06 ENCOUNTER — Encounter: Payer: Medicaid Other | Admitting: Family

## 2016-05-30 ENCOUNTER — Encounter (HOSPITAL_COMMUNITY): Payer: Self-pay | Admitting: Emergency Medicine

## 2016-05-30 ENCOUNTER — Emergency Department (HOSPITAL_COMMUNITY): Payer: Self-pay

## 2016-05-30 ENCOUNTER — Emergency Department (HOSPITAL_COMMUNITY)
Admission: EM | Admit: 2016-05-30 | Discharge: 2016-05-30 | Disposition: A | Discharge status: Home / Self Care | Payer: Self-pay | Attending: Emergency Medicine | Admitting: Emergency Medicine

## 2016-05-30 DIAGNOSIS — R55 Syncope and collapse: Secondary | ICD-10-CM | POA: Insufficient documentation

## 2016-05-30 DIAGNOSIS — R519 Headache, unspecified: Secondary | ICD-10-CM

## 2016-05-30 DIAGNOSIS — R51 Headache: Secondary | ICD-10-CM | POA: Insufficient documentation

## 2016-05-30 DIAGNOSIS — Z87891 Personal history of nicotine dependence: Secondary | ICD-10-CM | POA: Insufficient documentation

## 2016-05-30 LAB — CBC WITH DIFFERENTIAL/PLATELET
BASOS ABS: 0 10*3/uL (ref 0.0–0.1)
Basophils Relative: 0 %
Eosinophils Absolute: 0.1 10*3/uL (ref 0.0–0.7)
Eosinophils Relative: 2 %
HEMATOCRIT: 36.7 % (ref 36.0–46.0)
HEMOGLOBIN: 12.5 g/dL (ref 12.0–15.0)
LYMPHS PCT: 58 %
Lymphs Abs: 3.1 10*3/uL (ref 0.7–4.0)
MCH: 31.3 pg (ref 26.0–34.0)
MCHC: 34.1 g/dL (ref 30.0–36.0)
MCV: 91.8 fL (ref 78.0–100.0)
MONO ABS: 0.3 10*3/uL (ref 0.1–1.0)
Monocytes Relative: 6 %
NEUTROS ABS: 1.8 10*3/uL (ref 1.7–7.7)
NEUTROS PCT: 34 %
Platelets: 294 10*3/uL (ref 150–400)
RBC: 4 MIL/uL (ref 3.87–5.11)
RDW: 13.2 % (ref 11.5–15.5)
WBC: 5.3 10*3/uL (ref 4.0–10.5)

## 2016-05-30 LAB — COMPREHENSIVE METABOLIC PANEL
ALT: 23 U/L (ref 14–54)
ANION GAP: 12 (ref 5–15)
AST: 21 U/L (ref 15–41)
Albumin: 3.5 g/dL (ref 3.5–5.0)
Alkaline Phosphatase: 51 U/L (ref 38–126)
BUN: 11 mg/dL (ref 6–20)
CHLORIDE: 106 mmol/L (ref 101–111)
CO2: 19 mmol/L — AB (ref 22–32)
Calcium: 8.9 mg/dL (ref 8.9–10.3)
Creatinine, Ser: 0.9 mg/dL (ref 0.44–1.00)
GFR calc non Af Amer: >60 mL/min (ref >60)
Glucose, Bld: 127 mg/dL — ABNORMAL HIGH (ref 65–99)
Potassium: 3.4 mmol/L — ABNORMAL LOW (ref 3.5–5.1)
Sodium: 137 mmol/L (ref 135–145)
Total Bilirubin: 0.4 mg/dL (ref 0.3–1.2)
Total Protein: 6.5 g/dL (ref 6.5–8.1)

## 2016-05-30 LAB — I-STAT BETA HCG BLOOD, ED (MC, WL, AP ONLY): I-stat hCG, quantitative: <5 m[IU]/mL (ref <5)

## 2016-05-30 MED ORDER — KETOROLAC TROMETHAMINE 15 MG/ML IJ SOLN
15.0000 mg | Freq: Once | INTRAMUSCULAR | Status: AC
  Administered 2016-05-30: 15 mg via INTRAVENOUS
  Filled 2016-05-30: qty 1

## 2016-05-30 MED ORDER — PROCHLORPERAZINE EDISYLATE 5 MG/ML IJ SOLN
10.0000 mg | Freq: Once | INTRAMUSCULAR | Status: AC
  Administered 2016-05-30: 10 mg via INTRAVENOUS
  Filled 2016-05-30: qty 2

## 2016-05-30 MED ORDER — SODIUM CHLORIDE 0.9 % IV BOLUS (SEPSIS)
1000.0000 mL | Freq: Once | INTRAVENOUS | Status: AC
  Administered 2016-05-30: 1000 mL via INTRAVENOUS

## 2016-05-30 MED ORDER — DIPHENHYDRAMINE HCL 50 MG/ML IJ SOLN
25.0000 mg | Freq: Once | INTRAMUSCULAR | Status: AC
  Administered 2016-05-30: 25 mg via INTRAVENOUS
  Filled 2016-05-30: qty 1

## 2016-05-30 NOTE — ED Provider Notes (Signed)
[facility]-EMERGENCY DEPT Provider Note   CSN: [id number] Arrival date & time: 05/30/16  0216     History   Chief Complaint Chief Complaint  Patient presents with  . Syncope    Headache/Lightheaded    HPI Linda Mcmahon is a 30 y.o. female.  HPI  This is a 30 year old female who presents with headache and syncope. Patient reports that she woke up at 2 AM with a pounding frontal headache. She has a history of headaches with states that this is worse. She got up to the bathroom. She denies any weakness, numbness, tingling. She does endorse blurry vision and lightheadedness. She states that she had a bowel movement and began to feel very dizzy and lightheaded. She got off the toilet and subsequently "passed out."  She denies any recent fevers or neck stiffness. Rates her current pain at 9 out of 10. Denies any chest pain or shortness of breath. No history of syncope.  Past Medical History:  Diagnosis Date  . Bronchitis   . Medical history non-contributory     There are no active problems to display for this patient.   Past Surgical History:  Procedure Laterality Date  . NO PAST SURGERIES      OB History    Gravida Para Term Preterm AB Living   1 1   1   1    SAB TAB Ectopic Multiple Live Births                   Home Medications    Prior to Admission medications   Not on File    Family History Family History  Problem Relation Age of Onset  . Cancer Maternal Grandmother     Social History Social History  Substance Use Topics  . Smoking status: Former Games developermoker  . Smokeless tobacco: Never Used  . Alcohol use Yes     Comment: occasionaly     Allergies   Patient has no known allergies.   Review of Systems Review of Systems  Constitutional: Negative for fever.  Respiratory: Negative for shortness of breath.   Cardiovascular: Negative for chest pain.  Gastrointestinal: Negative for nausea and vomiting.  Musculoskeletal: Negative for neck pain.    Neurological: Positive for syncope and headaches.  All other systems reviewed and are negative.    Physical Exam Updated Vital Signs BP 105/62 (BP Location: Left Arm)   Pulse 79   Temp 98.7 F (37.1 C) (Oral)   Resp 18   LMP 05/28/2016   SpO2 99%   Physical Exam  Constitutional: She is oriented to person, place, and time. She appears well-developed and well-nourished.  HENT:  Head: Normocephalic and atraumatic.  Mouth/Throat: Oropharynx is clear and moist.  Eyes: Pupils are equal, round, and reactive to light.  Neck: Normal range of motion. Neck supple.  Cardiovascular: Normal rate, regular rhythm and normal heart sounds.   No murmur heard. Pulmonary/Chest: Effort normal and breath sounds normal. No respiratory distress. She has no wheezes.  Abdominal: Soft. There is no tenderness. There is no guarding.  Neurological: She is alert and oriented to person, place, and time.  Cranial nerves II through XII intact, 5 out of 5 strength in all 4 extremities, no dysmetria to finger-nose-finger  Skin: Skin is warm and dry.  Psychiatric: She has a normal mood and affect.  Nursing note and vitals reviewed.    ED Treatments / Results  Labs (all labs ordered are listed, but only abnormal results are displayed) Labs Reviewed  COMPREHENSIVE METABOLIC PANEL - Abnormal; Notable for the following:       Result Value   Potassium 3.4 (*)    CO2 19 (*)    Glucose, Bld 127 (*)    All other components within normal limits  CBC WITH DIFFERENTIAL/PLATELET  I-STAT BETA HCG BLOOD, ED ([facility], [facility], [facility] ONLY)    EKG  EKG Interpretation  Date/Time:  Tuesday May 30 2016 05:07:02 EST Ventricular Rate:  75 PR Interval:    QRS Duration: 75 QT Interval:  402 QTC Calculation: 449 R Axis:   81 Text Interpretation:  Sinus rhythm Consider right atrial enlargement Confirmed by Wilkie AyeHORTON  MD, Toni AmendOURTNEY ([id number]) on 05/30/2016 5:32:08 AM       Radiology Ct Head Wo Contrast  Result Date:  05/30/2016 CLINICAL DATA:  30 y/o F; brief syncopal episode at home this evening with mild headache and lightheadedness. EXAM: CT HEAD WITHOUT CONTRAST TECHNIQUE: Contiguous axial images were obtained from the base of the skull through the vertex without intravenous contrast. COMPARISON:  05/31/2015 CT head. FINDINGS: Brain: No evidence of acute infarction, hemorrhage, hydrocephalus, extra-axial collection or mass lesion/mass effect. Vascular: No hyperdense vessel or unexpected calcification. Skull: Normal. Negative for fracture or focal lesion. Sinuses/Orbits: No acute finding. Other: None. IMPRESSION: No acute intracranial abnormality.  Unremarkable CT of the head. Electronically Signed   By: Mitzi HansenLance  Furusawa-Stratton M.D.   On: 05/30/2016 06:09    Procedures Procedures (including critical care time)  Medications Ordered in ED Medications  sodium chloride 0.9 % bolus 1,000 mL (0 mLs Intravenous Stopped 05/30/16 0725)  prochlorperazine (COMPAZINE) injection 10 mg (10 mg Intravenous Given 05/30/16 0544)  diphenhydrAMINE (BENADRYL) injection 25 mg (25 mg Intravenous Given 05/30/16 0544)  ketorolac (TORADOL) 15 MG/ML injection 15 mg (15 mg Intravenous Given 05/30/16 0742)     Initial Impression / Assessment and Plan / ED Course  I have reviewed the triage vital signs and the nursing notes.  Pertinent labs & imaging results that were available during my care of the patient were reviewed by me and considered in my medical decision making (see chart for details).  Clinical Course     Patient presents with headache and syncope. Onset of symptoms less than 6 hours prior to evaluation. Difficult to know whether these are related as patient had syncope following a bowel movement. She did have a prodrome which is reassuring. This could be a vagal event. However given that her headache is worse than normal, will obtain a CT scan to rule out subarachnoid hemorrhage. She received CT within 6 hours of onset  which is very sensitive for ruling out subarachnoid hemorrhage. CT negative. Patient was treated with a migraine cocktail. Orthostatics negative. EKG without evidence of arrhythmia. Patient reports pain is now 2 out of 10 on recheck after migraine cocktail. She is non-focal.  After history, exam, and medical workup I feel the patient has been appropriately medically screened and is safe for discharge home. Pertinent diagnoses were discussed with the patient. Patient was given return precautions.   Final Clinical Impressions(s) / ED Diagnoses   Final diagnoses:  Syncope, unspecified syncope type  Acute nonintractable headache, unspecified headache type    New Prescriptions There are no discharge medications for this patient.    Shon Batonourtney F Horton, MD 05/30/16 [id number]

## 2016-05-30 NOTE — ED Triage Notes (Signed)
Pt. reports brief syncopal episode at home this evening with mild headache and lightheaded , denies injury or pain , alert and oriented/ respirations unlabored.

## 2016-05-30 NOTE — Discharge Instructions (Signed)
You were seen today for headache and passing out. Unclear if these are related. Your episode of passing out may be related to just having had a bowel movement. Your head scan and neurologic exam is reassuring.  Follow-up with your primary physician. If your symptoms worsen, you need to be reevaluated.

## 2017-08-12 ENCOUNTER — Other Ambulatory Visit: Payer: Self-pay

## 2017-08-12 ENCOUNTER — Emergency Department (HOSPITAL_COMMUNITY)
Admission: EM | Admit: 2017-08-12 | Discharge: 2017-08-12 | Disposition: A | Discharge status: Home / Self Care | Payer: Medicaid Other | Attending: Emergency Medicine | Admitting: Emergency Medicine

## 2017-08-12 ENCOUNTER — Emergency Department (HOSPITAL_COMMUNITY): Payer: Medicaid Other

## 2017-08-12 ENCOUNTER — Encounter (HOSPITAL_COMMUNITY): Payer: Self-pay | Admitting: Emergency Medicine

## 2017-08-12 DIAGNOSIS — R221 Localized swelling, mass and lump, neck: Secondary | ICD-10-CM | POA: Insufficient documentation

## 2017-08-12 DIAGNOSIS — F17200 Nicotine dependence, unspecified, uncomplicated: Secondary | ICD-10-CM | POA: Insufficient documentation

## 2017-08-12 DIAGNOSIS — R079 Chest pain, unspecified: Secondary | ICD-10-CM | POA: Insufficient documentation

## 2017-08-12 LAB — BASIC METABOLIC PANEL
ANION GAP: 11 (ref 5–15)
BUN: 9 mg/dL (ref 6–20)
CALCIUM: 9.1 mg/dL (ref 8.9–10.3)
CO2: 20 mmol/L — AB (ref 22–32)
Chloride: 107 mmol/L (ref 101–111)
Creatinine, Ser: 0.81 mg/dL (ref 0.44–1.00)
GFR calc Af Amer: >60 mL/min (ref >60)
GFR calc non Af Amer: >60 mL/min (ref >60)
GLUCOSE: 82 mg/dL (ref 65–99)
Potassium: 3.3 mmol/L — ABNORMAL LOW (ref 3.5–5.1)
Sodium: 138 mmol/L (ref 135–145)

## 2017-08-12 LAB — CBC
HCT: 36.7 % (ref 36.0–46.0)
HEMOGLOBIN: 12.8 g/dL (ref 12.0–15.0)
MCH: 31.3 pg (ref 26.0–34.0)
MCHC: 34.9 g/dL (ref 30.0–36.0)
MCV: 89.7 fL (ref 78.0–100.0)
Platelets: 325 10*3/uL (ref 150–400)
RBC: 4.09 MIL/uL (ref 3.87–5.11)
RDW: 13.4 % (ref 11.5–15.5)
WBC: 4.6 10*3/uL (ref 4.0–10.5)

## 2017-08-12 LAB — I-STAT TROPONIN, ED: Troponin i, poc: 0 ng/mL (ref 0.00–0.08)

## 2017-08-12 LAB — I-STAT BETA HCG BLOOD, ED (MC, WL, AP ONLY): I-stat hCG, quantitative: <5 m[IU]/mL (ref <5)

## 2017-08-12 MED ORDER — FAMOTIDINE-CA CARB-MAG HYDROX 10-800-165 MG PO CHEW: 1.0000 | CHEWABLE_TABLET | Freq: Two times a day (BID) | ORAL | 0 refills | Status: DC

## 2017-08-12 NOTE — ED Provider Notes (Signed)
[facility] EMERGENCY DEPARTMENT Provider Note   CSN: [id number] Arrival date & time: 08/12/17  1529     History   Chief Complaint Chief Complaint  Patient presents with  . Chest Pain    HPI Linda Mcmahon is a 32 y.o. female.  HPI Pt has been having sharp pain in her chest with occasional tightness.  It increases especially when she is agitated.  Not increasing with exertion.  Not with taking deep breaths.  THe episodes last for 2-3 minutes.  No fevers.  No leg swelling.  Pt does smoke but she quit.  No history of heart or lung problems   Past Medical History:  Diagnosis Date  . Bronchitis   . Medical history non-contributory     There are no active problems to display for this patient.   Past Surgical History:  Procedure Laterality Date  . NO PAST SURGERIES      OB History    Gravida Para Term Preterm AB Living   1 1   1   1    SAB TAB Ectopic Multiple Live Births                   Home Medications    Prior to Admission medications   Medication Sig Start Date End Date Taking? Authorizing Provider  famotidine-calcium carbonate-magnesium hydroxide (PEPCID COMPLETE) 10-800-165 MG chewable tablet Chew 1 tablet by mouth 2 (two) times daily. 08/12/17   Linwood Dibbles, MD    Family History Family History  Problem Relation Age of Onset  . Cancer Maternal Grandmother     Social History Social History   Tobacco Use  . Smoking status: Current Every Day Smoker  . Smokeless tobacco: Never Used  Substance Use Topics  . Alcohol use: Yes    Comment: occasionaly  . Drug use: No     Allergies   Patient has no known allergies.   Review of Systems Review of Systems  All other systems reviewed and are negative.    Physical Exam Updated Vital Signs BP 111/69   Pulse (!) 57   Temp 98.4 F (36.9 C) (Oral)   Resp 10   Ht 1.575 m (5\' 2" )   Wt 56.7 kg (125 lb)   LMP 07/25/2017   SpO2 100%   BMI 22.86 kg/m   Physical Exam    Constitutional: She appears well-developed and well-nourished. No distress.  HENT:  Head: Normocephalic and atraumatic.  Right Ear: External ear normal.  Left Ear: External ear normal.  Eyes: Conjunctivae are normal. Right eye exhibits no discharge. Left eye exhibits no discharge. No scleral icterus.  Neck: Neck supple. No tracheal deviation present.  Small approximately 1 cm size superficial mobile nodule below the skin in the anterior neck above the tracheal cartilage  Cardiovascular: Normal rate, regular rhythm and intact distal pulses.  Pulmonary/Chest: Effort normal and breath sounds normal. No stridor. No respiratory distress. She has no wheezes. She has no rales.  Abdominal: Soft. Bowel sounds are normal. She exhibits no distension. There is no tenderness. There is no rebound and no guarding.  Musculoskeletal: She exhibits no edema or tenderness.  Lymphadenopathy:       Head (right side): No submandibular adenopathy present.       Head (left side): No submandibular adenopathy present.    She has no cervical adenopathy.       Right cervical: No superficial cervical adenopathy present.      Left cervical: No superficial cervical adenopathy present.  Neurological: She is alert. She has normal strength. No cranial nerve deficit (no facial droop, extraocular movements intact, no slurred speech) or sensory deficit. She exhibits normal muscle tone. She displays no seizure activity. Coordination normal.  Skin: Skin is warm and dry. No rash noted.  Psychiatric: She has a normal mood and affect.  Nursing note and vitals reviewed.    ED Treatments / Results  Labs (all labs ordered are listed, but only abnormal results are displayed) Labs Reviewed  BASIC METABOLIC PANEL - Abnormal; Notable for the following components:      Result Value   Potassium 3.3 (*)    CO2 20 (*)    All other components within normal limits  CBC  I-STAT TROPONIN, ED  I-STAT BETA HCG BLOOD, ED ([facility], [facility], [facility] ONLY)     EKG  EKG Interpretation  Date/Time:  Sunday August 12 2017 15:32:09 EST Ventricular Rate:  82 PR Interval:  138 QRS Duration: 86 QT Interval:  376 QTC Calculation: 439 R Axis:   84 Text Interpretation:  Normal sinus rhythm with sinus arrhythmia Right atrial enlargement Borderline ECG No significant change since last tracing except nonspecific t wave changes Confirmed by Danner Paulding ([id number]) on 08/12/2017 5:30:00 PM       Radiology Dg Chest 2 View  Result Date: 08/12/2017 CLINICAL DATA:  Acute chest pain for 1 week. EXAM: CHEST  2 VIEW COMPARISON:  04/12/2015 and prior exams FINDINGS: The cardiomediastinal silhouette is unremarkable. There is no evidence of focal airspace disease, pulmonary edema, suspicious pulmonary nodule/mass, pleural effusion, or pneumothorax. No acute bony abnormalities are identified. IMPRESSION: No active cardiopulmonary disease. Electronically Signed   By: Jeffrey  Hu M.D.   On: 08/12/2017 16:27    Procedures Procedures (including critical care time)  Medications Ordered in ED Medications - No data to display   Initial Impression / Assessment and Plan / ED Course  I have reviewed the triage vital signs and the nursing notes.  Pertinent labs & imaging results that were available during my care of the patient were reviewed by me and considered in my medical decision making (see chart for details).   Patient presented to the emergency room for evaluation of chest pain.  Patient does not have any cardiac risk factors smoking.  No risk factors for PE.  Her ED workup is reassuring including her chest x-ray laboratory tests and EKG.  I doubt acute coronary syndrome or pulmonary embolism.  Patient mentions some intermittent issues with increased acid production after eating certain foods.  He will try on a course of antacids and discussed follow-up with a primary care doctor.  She also has a small neck nodule that is mobile and soft.  Seems more cystic in  nature.  I recommended she keep an eye on this and have it rechecked if it does not resolve over the next month. Final Clinical Impressions(s) / ED Diagnoses   Final diagnoses:  Chest pain, unspecified type    ED Discharge Orders        Ordered    famotidine-calcium carbonate-magnesium hydroxide (PEPCID COMPLETE) 10-800-165 MG chewable tablet  2 times daily     02 /24/19 1814       Linwood DibblesKnapp, Huyen Perazzo, MD 08/12/17 1818

## 2017-08-12 NOTE — Discharge Instructions (Signed)
Take the antacid medication, I wrote a prescription but this available over the counter, follow up with a primary care doctor if the symptoms do not resolve over the next week

## 2017-08-12 NOTE — ED Triage Notes (Signed)
C/o sharp pain/tightness across chest x 1 week.  Denies sob, nausea, and vomiting. Also reports lump in throat x 1 month.  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

## 2017-08-27 ENCOUNTER — Encounter (HOSPITAL_COMMUNITY): Payer: Self-pay | Admitting: Emergency Medicine

## 2017-08-27 ENCOUNTER — Ambulatory Visit (HOSPITAL_COMMUNITY)
Admission: EM | Admit: 2017-08-27 | Discharge: 2017-08-27 | Disposition: A | Discharge status: Home / Self Care | Payer: Medicaid Other | Attending: Family Medicine | Admitting: Family Medicine

## 2017-08-27 DIAGNOSIS — J069 Acute upper respiratory infection, unspecified: Secondary | ICD-10-CM

## 2017-08-27 DIAGNOSIS — R0981 Nasal congestion: Secondary | ICD-10-CM | POA: Diagnosis present

## 2017-08-27 DIAGNOSIS — R42 Dizziness and giddiness: Secondary | ICD-10-CM | POA: Insufficient documentation

## 2017-08-27 DIAGNOSIS — R05 Cough: Secondary | ICD-10-CM | POA: Diagnosis present

## 2017-08-27 LAB — POCT RAPID STREP A: STREPTOCOCCUS, GROUP A SCREEN (DIRECT): NEGATIVE

## 2017-08-27 MED ORDER — MECLIZINE HCL 25 MG PO TABS: 25.0000 mg | ORAL_TABLET | Freq: Two times a day (BID) | ORAL | 0 refills | Status: AC | PRN

## 2017-08-27 MED ORDER — IPRATROPIUM BROMIDE 0.06 % NA SOLN: 2.0000 | Freq: Four times a day (QID) | NASAL | 0 refills | Status: DC

## 2017-08-27 MED ORDER — PREDNISONE 20 MG PO TABS: 40.0000 mg | ORAL_TABLET | Freq: Every day | ORAL | 0 refills | Status: AC

## 2017-08-27 MED ORDER — FLUTICASONE PROPIONATE 50 MCG/ACT NA SUSP: 2.0000 | Freq: Every day | NASAL | 0 refills | Status: DC

## 2017-08-27 MED ORDER — BENZONATATE 100 MG PO CAPS: 100.0000 mg | ORAL_CAPSULE | Freq: Three times a day (TID) | ORAL | 0 refills | Status: DC

## 2017-08-27 NOTE — Discharge Instructions (Signed)
Tessalon for cough. Meclizine for dizziness. Prednisone for sinus pressure. Start flonase, atrovent nasal spray for nasal congestion/drainage. You can use over the counter nasal saline rinse such as neti pot for nasal congestion. Keep hydrated, your urine should be clear to pale yellow in color. Tylenol/motrin for fever and pain. Monitor for any worsening of symptoms, chest pain, shortness of breath, wheezing, swelling of the throat, follow up for reevaluation.   For sore throat try using a honey-based tea. Use 3 teaspoons of honey with juice squeezed from half lemon. Place shaved pieces of ginger into 1/2-1 cup of water and warm over stove top. Then mix the ingredients and repeat every 4 hours as needed.

## 2017-08-27 NOTE — ED Provider Notes (Signed)
[facility]    CSN: [id number] Arrival date & time: 08/27/17  1715     History   Chief Complaint Chief Complaint  Patient presents with  . Cough    HPI Linda Mcmahon is a 32 y.o. female.   32 year old female comes in for 3-day history of URI symptoms.  Has had productive cough, rhinorrhea, nasal congestion.  Denies fever, chills, night sweats.  Has had some dizziness as described as room spinning, only with movement.  Frontal headache that is described as pressure and sinus infection like.  OTC NyQuil, DayQuil, lozenges without relief.  Former smoker.      Past Medical History:  Diagnosis Date  . Bronchitis   . Medical history non-contributory     There are no active problems to display for this patient.   Past Surgical History:  Procedure Laterality Date  . NO PAST SURGERIES      OB History    Gravida Para Term Preterm AB Living   1 1   1   1    SAB TAB Ectopic Multiple Live Births                   Home Medications    Prior to Admission medications   Medication Sig Start Date End Date Taking? Authorizing Provider  benzonatate (TESSALON) 100 MG capsule Take 1 capsule (100 mg total) by mouth every 8 (eight) hours. 08/27/17   Belinda FisherYu, Arta Stump V, PA-C  famotidine-calcium carbonate-magnesium hydroxide (PEPCID COMPLETE) 10-800-165 MG chewable tablet Chew 1 tablet by mouth 2 (two) times daily. 08/12/17   Linwood DibblesKnapp, Jon, MD  fluticasone (FLONASE) 50 MCG/ACT nasal spray Place 2 sprays into both nostrils daily. 08/27/17   Cathie HoopsYu, Kamsiyochukwu Buist V, PA-C  ipratropium (ATROVENT) 0.06 % nasal spray Place 2 sprays into both nostrils 4 (four) times daily. 08/27/17   Belinda FisherYu, Laine Fonner V, PA-C  meclizine (ANTIVERT) 25 MG tablet Take 1 tablet (25 mg total) by mouth 2 (two) times daily as needed for dizziness. 08/27/17   Cathie HoopsYu, Mirabel Ahlgren V, PA-C  predniSONE (DELTASONE) 20 MG tablet Take 2 tablets (40 mg total) by mouth daily for 4 days. 08/27/17 08/31/17  Belinda FisherYu, Caidence Higashi V, PA-C    Family History Family History  Problem  Relation Age of Onset  . Cancer Maternal Grandmother     Social History Social History   Tobacco Use  . Smoking status: Current Every Day Smoker  . Smokeless tobacco: Never Used  Substance Use Topics  . Alcohol use: Yes    Comment: occasionaly  . Drug use: No     Allergies   Patient has no known allergies.   Review of Systems Review of Systems  Reason unable to perform ROS: See HPI as above.     Physical Exam Triage Vital Signs ED Triage Vitals [08/27/17 1755]  Enc Vitals Group     BP (!) 121/56     Pulse Rate 85     Resp 18     Temp 98.5 F (36.9 C)     Temp Source Oral     SpO2 100 %     Weight      Height      Head Circumference      Peak Flow      Pain Score 7     Pain Loc      Pain Edu?      Excl. in GC?    No data found.  Updated Vital Signs BP (!) 121/56 (BP Location: Right Arm)  Pulse 85   Temp 98.5 F (36.9 C) (Oral)   Resp 18   SpO2 100%   Physical Exam  Constitutional: She is oriented to person, place, and time. She appears well-developed and well-nourished. No distress.  HENT:  Head: Normocephalic and atraumatic.  Right Ear: External ear and ear canal normal. Tympanic membrane is erythematous. Tympanic membrane is not bulging.  Left Ear: External ear and ear canal normal. Tympanic membrane is erythematous. Tympanic membrane is not bulging.  Nose: Mucosal edema and rhinorrhea present. Right sinus exhibits no maxillary sinus tenderness and no frontal sinus tenderness. Left sinus exhibits no maxillary sinus tenderness and no frontal sinus tenderness.  Mouth/Throat: Uvula is midline and mucous membranes are normal. Posterior oropharyngeal erythema present. No tonsillar exudate.  Eyes: Conjunctivae are normal. Pupils are equal, round, and reactive to light.  Neck: Normal range of motion. Neck supple.  Cardiovascular: Normal rate, regular rhythm and normal heart sounds. Exam reveals no gallop and no friction rub.  No murmur  heard. Pulmonary/Chest: Effort normal and breath sounds normal. She has no decreased breath sounds. She has no wheezes. She has no rhonchi. She has no rales.  Lymphadenopathy:    She has no cervical adenopathy.  Neurological: She is alert and oriented to person, place, and time. She has normal strength. She is not disoriented. Coordination and gait normal. GCS eye subscore is 4. GCS verbal subscore is 5. GCS motor subscore is 6.  Skin: Skin is warm and dry.  Psychiatric: She has a normal mood and affect. Her behavior is normal. Judgment normal.    UC Treatments / Results  Labs (all labs ordered are listed, but only abnormal results are displayed) Labs Reviewed  CULTURE, GROUP A STREP [facility])  POCT RAPID STREP A    EKG  EKG Interpretation None       Radiology No results found.  Procedures Procedures (including critical care time)  Medications Ordered in UC Medications - No data to display   Initial Impression / Assessment and Plan / UC Course  I have reviewed the triage vital signs and the nursing notes.  Pertinent labs & imaging results that were available during my care of the patient were reviewed by me and considered in my medical decision making (see chart for details).    Discussed with patient history and exam most consistent with viral URI.Prednisone for sinus pressure. Symptomatic treatment as needed. Push fluids. Return precautions given. Patient expresses understanding and agrees to plan.    Final Clinical Impressions(s) / UC Diagnoses   Final diagnoses:  Viral URI    ED Discharge Orders        Ordered    benzonatate (TESSALON) 100 MG capsule  Every 8 hours     08/27/17 1846    fluticasone (FLONASE) 50 MCG/ACT nasal spray  Daily     08/27/17 1846    ipratropium (ATROVENT) 0.06 % nasal spray  4 times daily     08/27/17 1846    predniSONE (DELTASONE) 20 MG tablet  Daily     08/27/17 1846    meclizine (ANTIVERT) 25 MG tablet  2 times daily PRN      08/27/17 1847        Belinda Fisher, PA-C 08/27/17 1850

## 2017-08-27 NOTE — ED Triage Notes (Signed)
Pt here for URI sx with sore throat x 3 days 

## 2017-08-30 LAB — CULTURE, GROUP A STREP (THRC)

## 2017-10-01 ENCOUNTER — Other Ambulatory Visit: Payer: Self-pay | Admitting: Otolaryngology

## 2017-10-01 DIAGNOSIS — E041 Nontoxic single thyroid nodule: Secondary | ICD-10-CM

## 2017-10-05 ENCOUNTER — Ambulatory Visit
Admission: RE | Admit: 2017-10-05 | Discharge: 2017-10-05 | Disposition: A | Discharge status: Home / Self Care | Payer: Medicaid Other | Source: Ambulatory Visit | Attending: Otolaryngology | Admitting: Otolaryngology

## 2017-10-05 DIAGNOSIS — E041 Nontoxic single thyroid nodule: Secondary | ICD-10-CM

## 2017-10-15 ENCOUNTER — Emergency Department (HOSPITAL_COMMUNITY)
Admission: EM | Admit: 2017-10-15 | Discharge: 2017-10-15 | Disposition: A | Discharge status: Home / Self Care | Payer: Medicaid Other | Attending: Emergency Medicine | Admitting: Emergency Medicine

## 2017-10-15 ENCOUNTER — Other Ambulatory Visit: Payer: Self-pay

## 2017-10-15 ENCOUNTER — Encounter (HOSPITAL_COMMUNITY): Payer: Self-pay | Admitting: Emergency Medicine

## 2017-10-15 DIAGNOSIS — F17200 Nicotine dependence, unspecified, uncomplicated: Secondary | ICD-10-CM | POA: Diagnosis not present

## 2017-10-15 DIAGNOSIS — J020 Streptococcal pharyngitis: Secondary | ICD-10-CM | POA: Insufficient documentation

## 2017-10-15 DIAGNOSIS — J029 Acute pharyngitis, unspecified: Secondary | ICD-10-CM | POA: Diagnosis present

## 2017-10-15 DIAGNOSIS — Z79899 Other long term (current) drug therapy: Secondary | ICD-10-CM | POA: Insufficient documentation

## 2017-10-15 LAB — GROUP A STREP BY PCR: Group A Strep by PCR: DETECTED — AB

## 2017-10-15 MED ORDER — ACETAMINOPHEN 325 MG PO TABS
650.0000 mg | ORAL_TABLET | Freq: Once | ORAL | Status: AC | PRN
  Administered 2017-10-15: 650 mg via ORAL
  Filled 2017-10-15: qty 2

## 2017-10-15 MED ORDER — AMOXICILLIN 500 MG PO CAPS: 500.0000 mg | ORAL_CAPSULE | Freq: Two times a day (BID) | ORAL | 0 refills | Status: AC

## 2017-10-15 NOTE — ED Provider Notes (Signed)
[facility] EMERGENCY DEPARTMENT Provider Note   CSN: [id number] Arrival date & time: 10/15/17  5638     History   Chief Complaint Chief Complaint  Patient presents with  . Sore Throat    HPI Linda Mcmahon is a 32 y.o. female.  HPI  Linda Mcmahon is a 32 year old female with no significant past medical history who presents to the emergency department for evaluation of sore throat.  Patient reports that sore throat began yesterday.  States that pain is severe and feels sharp in nature.  It occasionally radiates to her right ear.  Pain is worsened with swallowing.  She also states that her throat feels swollen and that she has painful lymph nodes.  She reports tactile fever at home yesterday.  States she has been taking Tylenol for her symptoms without adequate pain relief.  She denies sick contacts at home with similar.  She denies dysphagia, trismus, trouble breathing, neck pain/stiffness, headache, congestion, hearing loss, otorrhea, cough.    Past Medical History:  Diagnosis Date  . Bronchitis   . Medical history non-contributory     There are no active problems to display for this patient.   Past Surgical History:  Procedure Laterality Date  . NO PAST SURGERIES       OB History    Gravida  1   Para  1   Term      Preterm  1   AB      Living  1     SAB      TAB      Ectopic      Multiple      Live Births               Home Medications    Prior to Admission medications   Medication Sig Start Date End Date Taking? Authorizing Provider  benzonatate (TESSALON) 100 MG capsule Take 1 capsule (100 mg total) by mouth every 8 (eight) hours. 08/27/17   Belinda Fisher, PA-C  famotidine-calcium carbonate-magnesium hydroxide (PEPCID COMPLETE) 10-800-165 MG chewable tablet Chew 1 tablet by mouth 2 (two) times daily. 08/12/17   Linwood Dibbles, MD  fluticasone (FLONASE) 50 MCG/ACT nasal spray Place 2 sprays into both nostrils daily. 08/27/17   Cathie Hoops, Amy  V, PA-C  ipratropium (ATROVENT) 0.06 % nasal spray Place 2 sprays into both nostrils 4 (four) times daily. 08/27/17   Belinda Fisher, PA-C  meclizine (ANTIVERT) 25 MG tablet Take 1 tablet (25 mg total) by mouth 2 (two) times daily as needed for dizziness. 08/27/17   Belinda Fisher, PA-C    Family History Family History  Problem Relation Age of Onset  . Cancer Maternal Grandmother     Social History Social History   Tobacco Use  . Smoking status: Current Every Day Smoker  . Smokeless tobacco: Never Used  Substance Use Topics  . Alcohol use: Yes    Comment: occasionaly  . Drug use: No     Allergies   Patient has no known allergies.   Review of Systems Review of Systems  Constitutional: Positive for chills and fever.  HENT: Positive for ear pain and sore throat. Negative for congestion, hearing loss, rhinorrhea, sinus pressure, sinus pain and trouble swallowing.   Respiratory: Negative for cough.   Neurological: Negative for headaches.     Physical Exam Updated Vital Signs BP 102/67 (BP Location: Right Arm)   Pulse 82   Temp 98.4 F (36.9 C) (Oral)   Resp  16   LMP 10/08/2017 (Exact Date)   SpO2 100%   Physical Exam  Constitutional: She appears well-developed and well-nourished. No distress.  Sitting at bedside in no apparent distress, nontoxic-appearing.  HENT:  Head: Normocephalic and atraumatic.  Nose: Nose normal.  Posterior oropharynx erythematous.  Bilateral 2+ tonsillar swelling.  Tonsillar exudate present.  Uvula midline.  Able to handle oral secretions.  Bilateral TMs with good cone of light.  Eyes: Pupils are equal, round, and reactive to light. Conjunctivae are normal. Right eye exhibits no discharge. Left eye exhibits no discharge.  Neck: Normal range of motion. Neck supple.  Bilateral anterior chain cervical adenopathy.  Cardiovascular: Normal rate and regular rhythm.  Pulmonary/Chest: Effort normal and breath sounds normal. No stridor. No respiratory distress.  She has no wheezes. She has no rales.  Neurological: She is alert. Coordination normal.  Skin: She is not diaphoretic.  Psychiatric: She has a normal mood and affect. Her behavior is normal.  Nursing note and vitals reviewed.    ED Treatments / Results  Labs (all labs ordered are listed, but only abnormal results are displayed) Labs Reviewed  GROUP A STREP BY PCR - Abnormal; Notable for the following components:      Result Value   Group A Strep by PCR DETECTED (*)    All other components within normal limits    EKG None  Radiology No results found.  Procedures Procedures (including critical care time)  Medications Ordered in ED Medications  acetaminophen (TYLENOL) tablet 650 mg (650 mg Oral Given 10/15/17 7829)     Initial Impression / Assessment and Plan / ED Course  I have reviewed the triage vital signs and the nursing notes.  Pertinent labs & imaging results that were available during my care of the patient were reviewed by me and considered in my medical decision making (see chart for details).     Pt febrile with tonsillar exudate, cervical lymphadenopathy, & odynophagia. Rapid strep test positive. Patient was initially febrile and tachycardic, treated in the ED with tylenol and had subsequent improvement in vital signs.  Patient does not want penicillin IM, elects p.o. amoxicillin.  Pt appears well-hydrated.  Presentation non concerning for PTA or RPA. No trismus or uvula deviation. Specific return precautions discussed. Pt able to drink water in ED without difficulty with intact air way. Recommended PCP follow up.  Discussed return precautions and she agrees and voiced understanding to the above plan to discharge.  Final Clinical Impressions(s) / ED Diagnoses   Final diagnoses:  Strep throat    ED Discharge Orders        Ordered    amoxicillin (AMOXIL) 500 MG capsule  2 times daily     10/15/17 1004       Lawrence Marseilles 10/15/17 1714      Tegeler, Canary Brim, MD 10/15/17 [phone]

## 2017-10-15 NOTE — Discharge Instructions (Signed)
You have strep throat. Please take antibiotic twice a day for the next 10 days.  Please take all of your antibiotics until finished!   You may develop abdominal discomfort or diarrhea from the antibiotic.  You may help offset this with probiotics which you can buy or get in yogurt. Do not eat  or take the probiotics until 2 hours after your antibiotic.   As we discussed please take Tylenol at home for pain and any fever you may have.  You can also use warm liquids, salt water gargles, over-the-counter throat lozenges to soothe the throat  Return to the emergency department if you have any new or concerning symptoms like trouble swallowing, difficulty breathing.

## 2017-10-15 NOTE — ED Triage Notes (Signed)
Pt reports sore throat, body aches and chills since yesterday.

## 2017-10-16 ENCOUNTER — Telehealth: Payer: Self-pay | Admitting: *Deleted

## 2017-10-16 NOTE — Telephone Encounter (Signed)
Post ED Visit - Positive Culture Follow-up  Culture report reviewed by antimicrobial stewardship pharmacist:   Enzo Bi, Pharm.D.  Celedonio Miyamoto, Pharm.D., BCPS AQ-ID  Garvin Fila, Pharm.D., BCPS  Georgina Pillion, Pharm.D., BCPS  [place], [address].D., BCPS, AAHIVP  Estella Husk, Pharm.D., BCPS, AAHIVP  Lysle Pearl, PharmD, BCPS  Blake Divine, PharmD  Pollyann Samples, PharmD, BCPS Amy Juel Burrow PharmD Candidate  Positive strep culture Treated with Amoxicillin, organism sensitive to the same and no further patient follow-up is required at this time.  Virl Axe [facility] 10/16/2017, 10:09 AM

## 2017-10-24 ENCOUNTER — Other Ambulatory Visit: Payer: Self-pay | Admitting: Otolaryngology

## 2017-10-24 DIAGNOSIS — E041 Nontoxic single thyroid nodule: Secondary | ICD-10-CM

## 2017-11-01 ENCOUNTER — Other Ambulatory Visit: Payer: Self-pay | Admitting: Family

## 2017-11-01 DIAGNOSIS — N6310 Unspecified lump in the right breast, unspecified quadrant: Secondary | ICD-10-CM

## 2017-11-06 ENCOUNTER — Other Ambulatory Visit: Payer: Medicaid Other

## 2018-03-04 ENCOUNTER — Ambulatory Visit (INDEPENDENT_AMBULATORY_CARE_PROVIDER_SITE_OTHER): Payer: Medicaid Other

## 2018-03-04 ENCOUNTER — Encounter (HOSPITAL_COMMUNITY): Payer: Self-pay | Admitting: Emergency Medicine

## 2018-03-04 ENCOUNTER — Ambulatory Visit (HOSPITAL_COMMUNITY)
Admission: EM | Admit: 2018-03-04 | Discharge: 2018-03-04 | Disposition: A | Discharge status: Home / Self Care | Payer: Medicaid Other | Attending: Family Medicine | Admitting: Family Medicine

## 2018-03-04 DIAGNOSIS — S20211A Contusion of right front wall of thorax, initial encounter: Secondary | ICD-10-CM | POA: Diagnosis not present

## 2018-03-04 DIAGNOSIS — W19XXXA Unspecified fall, initial encounter: Secondary | ICD-10-CM | POA: Diagnosis not present

## 2018-03-04 MED ORDER — MELOXICAM 15 MG PO TABS: 15.0000 mg | ORAL_TABLET | Freq: Every day | ORAL | 0 refills | Status: AC

## 2018-03-04 NOTE — ED Triage Notes (Signed)
Pt states a week ago she slipped in the shower and landed on her R rib area, c/o ongoing rib pain.

## 2018-03-04 NOTE — ED Provider Notes (Signed)
[facility]    CSN: [id number] Arrival date & time: 03/04/18  0932     History   Chief Complaint Chief Complaint  Patient presents with  . Fall  . Rib Pain    HPI Linda Mcmahon is a 32 y.o. female.   Linda Mcmahon presents with complaints of right sided rib pain which started 1 week ago. She slipped getting out of her shower, striking her side on the side of the tub. Pain has not worsened but has not improved. Worse when she wakes in the morning, feels stiff. Pain if she coughs or sneezes, or if the area is touching. Pain 9/10. Initially tried ibuprofen but has not take recently as it did not help. Denies  Any previous similar. No other injury. Pain improves throughout the day as she is active. No asthma history. History of bronchitis. Smokes 1ppd.    ROS per HPI.      Past Medical History:  Diagnosis Date  . Bronchitis   . Medical history non-contributory     There are no active problems to display for this patient.   Past Surgical History:  Procedure Laterality Date  . NO PAST SURGERIES      OB History    Gravida  1   Para  1   Term      Preterm  1   AB      Living  1     SAB      TAB      Ectopic      Multiple      Live Births               Home Medications    Prior to Admission medications   Medication Sig Start Date End Date Taking? Authorizing Provider  acetaminophen (TYLENOL) 500 MG tablet Take 1,000 mg by mouth every 6 (six) hours as needed for moderate pain.    [provider]  meclizine (ANTIVERT) 25 MG tablet Take 1 tablet (25 mg total) by mouth 2 (two) times daily as needed for dizziness. Patient not taking: Reported on 10/15/2017 08/27/17   Belinda Fisher, PA-C  meloxicam (MOBIC) 15 MG tablet Take 1 tablet (15 mg total) by mouth daily. 03/04/18   Georgetta Haber, NP    Family History Family History  Problem Relation Age of Onset  . Cancer Maternal Grandmother     Social History Social History   Tobacco Use    . Smoking status: Current Every Day Smoker  . Smokeless tobacco: Never Used  Substance Use Topics  . Alcohol use: Yes    Comment: occasionaly  . Drug use: No     Allergies   Patient has no known allergies.   Review of Systems Review of Systems   Physical Exam Triage Vital Signs ED Triage Vitals [03/04/18 1038]  Enc Vitals Group     BP (!) 105/57     Pulse Rate 69     Resp 18     Temp 97.8 F (36.6 C)     Temp src      SpO2 100 %     Weight      Height      Head Circumference      Peak Flow      Pain Score      Pain Loc      Pain Edu?      Excl. in GC?    No data found.  Updated Vital Signs BP Marland Kitchen)  105/57   Pulse 69   Temp 97.8 F (36.6 C)   Resp 18   LMP 02/19/2018   SpO2 100%    Physical Exam  Constitutional: She is oriented to person, place, and time. She appears well-developed and well-nourished. No distress.  Cardiovascular: Normal rate, regular rhythm and normal heart sounds.  Pulmonary/Chest: Effort normal and breath sounds normal. Chest wall is not dull to percussion. She exhibits tenderness and bony tenderness. She exhibits no mass, no laceration, no deformity and no retraction.  Right lateral and anterolateral ribs with point tenderness on palpation; no visible bruising or swelling; no shortness of breath  Or increased work of breathing     Neurological: She is alert and oriented to person, place, and time.  Skin: Skin is warm and dry.     UC Treatments / Results  Labs (all labs ordered are listed, but only abnormal results are displayed) Labs Reviewed - No data to display  EKG None  Radiology Dg Ribs Unilateral W/chest Right  Result Date: 03/04/2018 CLINICAL DATA:  The patient fell in shower 1 week ago striking the right ribcage. Persistent pain upper right axillary region and under the right breast made worse with movement. Current smoker. EXAM: RIGHT RIBS AND CHEST - 3+ VIEW COMPARISON:  Chest x-ray of August 12, 2017 FINDINGS:  The lungs are well-expanded and clear. The heart and pulmonary vascularity are normal. The mediastinum is normal in width. There is no pleural effusion or pneumothorax. Right rib detail images reveal no acute displaced or nondisplaced fracture. There is stable mild biapical pleural thickening. IMPRESSION: No acute or healing rib fracture is observed. There is no acute cardiopulmonary abnormality. Electronically Signed   By: David  [place] M.D.   On: 03/04/2018 11:03    Procedures Procedures (including critical care time)  Medications Ordered in UC Medications - No data to display  Initial Impression / Assessment and Plan / UC Course  I have reviewed the triage vital signs and the nursing notes.  Pertinent labs & imaging results that were available during my care of the patient were reviewed by me and considered in my medical decision making (see chart for details).     Xray without acute findings of fracture. Consistent with contusion related to fall. meloxicam daily, ice application. Return precautions provided. If symptoms worsen or do not improve in the next week to return to be seen or to follow up with PCP.  Patient verbalized understanding and agreeable to plan.  Ambulatory out of clinic without difficulty.    Final Clinical Impressions(s) / UC Diagnoses   Final diagnoses:  Rib contusion, right, initial encounter     Discharge Instructions     There is no fracture or break to your ribs visualized on xray here today. Your lungs look well.  Your fall obviously caused some bruising, although we cannot see it, you are feeling it.  Ice application to help with pain.  Meloxicam daily, take with food. Do not take additional ibuprofen.  Light and regular activity as tolerated.  If symptoms worsen or do not improve in the next 2 weeks to return to be seen or to follow up with PCP.     ED Prescriptions    Medication Sig Dispense Auth. Provider   meloxicam (MOBIC) 15 MG tablet Take 1  tablet (15 mg total) by mouth daily. 20 tablet Georgetta Haber, NP     Controlled Substance Prescriptions [place] Controlled Substance Registry consulted? Not Applicable   Linus Mako  B, NP 03/04/18 1117

## 2018-03-04 NOTE — Discharge Instructions (Signed)
There is no fracture or break to your ribs visualized on xray here today. Your lungs look well.  Your fall obviously caused some bruising, although we cannot see it, you are feeling it.  Ice application to help with pain.  Meloxicam daily, take with food. Do not take additional ibuprofen.  Light and regular activity as tolerated.  If symptoms worsen or do not improve in the next 2 weeks to return to be seen or to follow up with PCP.

## 2019-10-29 ENCOUNTER — Ambulatory Visit: Payer: Self-pay | Admitting: Internal Medicine

## 2019-12-24 ENCOUNTER — Ambulatory Visit (HOSPITAL_COMMUNITY)
Admission: EM | Admit: 2019-12-24 | Discharge: 2019-12-24 | Disposition: A | Discharge status: Home / Self Care | Payer: Medicaid Other | Attending: Internal Medicine | Admitting: Internal Medicine

## 2019-12-24 ENCOUNTER — Encounter (HOSPITAL_COMMUNITY): Payer: Self-pay | Admitting: Emergency Medicine

## 2019-12-24 ENCOUNTER — Other Ambulatory Visit: Payer: Self-pay

## 2019-12-24 DIAGNOSIS — N760 Acute vaginitis: Secondary | ICD-10-CM

## 2019-12-24 DIAGNOSIS — N898 Other specified noninflammatory disorders of vagina: Secondary | ICD-10-CM

## 2019-12-24 DIAGNOSIS — Z3202 Encounter for pregnancy test, result negative: Secondary | ICD-10-CM

## 2019-12-24 LAB — POC URINE PREG, ED: Preg Test, Ur: NEGATIVE

## 2019-12-24 LAB — POCT URINALYSIS DIP (DEVICE)
Bilirubin Urine: NEGATIVE
Glucose, UA: NEGATIVE mg/dL
Hgb urine dipstick: NEGATIVE
Ketones, ur: NEGATIVE mg/dL
Leukocytes,Ua: NEGATIVE
Nitrite: NEGATIVE
Protein, ur: NEGATIVE mg/dL
Specific Gravity, Urine: >=1.03 (ref 1.005–1.030)
Urobilinogen, UA: 0.2 mg/dL (ref 0.0–1.0)
pH: 6.5 (ref 5.0–8.0)

## 2019-12-24 MED ORDER — FLUCONAZOLE 150 MG PO TABS: 150.0000 mg | ORAL_TABLET | Freq: Every day | ORAL | 0 refills | Status: DC

## 2019-12-24 NOTE — ED Triage Notes (Signed)
Pt sts vaginal discharge that she thinks is a yeast infection; pt sts she had a positive home pregnancy test but "she doesn't believe that it is true"; LMP was 12/03/2019

## 2019-12-24 NOTE — Discharge Instructions (Addendum)
Treating you for yeast infection Take the medicine as prescribed Follow up as needed for continued or worsening symptoms

## 2019-12-24 NOTE — ED Provider Notes (Signed)
[facility]    CSN: [id number] Arrival date & time: 12/24/19  1013      History   Chief Complaint Chief Complaint  Patient presents with  . Vaginal Discharge  . Possible Pregnancy    HPI Linda Mcmahon is a 34 y.o. female.   Pt is a 34 year old female that presents with vaginal discharge, swelling, irritation, thick white discharge. Symptoms have been constant. She hasn't tried anything for the symptoms. No abdominal pain, back pain, fever, urinary symptoms. She also reports positive pregnancy test at home. Patient's last menstrual period was 12/03/2019.  ROS per HPI      Past Medical History:  Diagnosis Date  . Bronchitis   . Medical history non-contributory     There are no problems to display for this patient.   Past Surgical History:  Procedure Laterality Date  . NO PAST SURGERIES      OB History    Gravida  1   Para  1   Term      Preterm  1   AB      Living  1     SAB      TAB      Ectopic      Multiple      Live Births               Home Medications    Prior to Admission medications   Medication Sig Start Date End Date Taking? Authorizing Provider  acetaminophen (TYLENOL) 500 MG tablet Take 1,000 mg by mouth every 6 (six) hours as needed for moderate pain.    [provider]  fluconazole (DIFLUCAN) 150 MG tablet Take 1 tablet (150 mg total) by mouth daily. 12/24/19   Dahlia Byes A, NP  meclizine (ANTIVERT) 25 MG tablet Take 1 tablet (25 mg total) by mouth 2 (two) times daily as needed for dizziness. Patient not taking: Reported on 10/15/2017 08/27/17   Belinda Fisher, PA-C  meloxicam (MOBIC) 15 MG tablet Take 1 tablet (15 mg total) by mouth daily. Patient not taking: Reported on 12/24/2019 03/04/18   Georgetta Haber, NP    Family History Family History  Problem Relation Age of Onset  . Cancer Maternal Grandmother     Social History Social History   Tobacco Use  . Smoking status: Current Every Day Smoker  .  Smokeless tobacco: Never Used  Substance Use Topics  . Alcohol use: Yes    Comment: occasionaly  . Drug use: No     Allergies   Patient has no known allergies.   Review of Systems Review of Systems   Physical Exam Triage Vital Signs ED Triage Vitals  Enc Vitals Group     BP 12/24/19 1050 113/75     Pulse Rate 12/24/19 1050 75     Resp 12/24/19 1050 18     Temp 12/24/19 1050 98.6 F (37 C)     Temp Source 12/24/19 1050 Oral     SpO2 12/24/19 1050 100 %     Weight --      Height --      Head Circumference --      Peak Flow --      Pain Score 12/24/19 1051 0     Pain Loc --      Pain Edu? --      Excl. in GC? --    No data found.  Updated Vital Signs BP 113/75 (BP Location: Right Arm)  Pulse 75   Temp 98.6 F (37 C) (Oral)   Resp 18   LMP 12/03/2019   SpO2 100%   Visual Acuity Right Eye Distance:   Left Eye Distance:   Bilateral Distance:    Right Eye Near:   Left Eye Near:    Bilateral Near:     Physical Exam Vitals and nursing note reviewed.  Constitutional:      General: She is not in acute distress.    Appearance: Normal appearance. She is not ill-appearing, toxic-appearing or diaphoretic.  HENT:     Head: Normocephalic.     Nose: Nose normal.  Eyes:     Conjunctiva/sclera: Conjunctivae normal.  Pulmonary:     Effort: Pulmonary effort is normal.  Musculoskeletal:        General: Normal range of motion.     Cervical back: Normal range of motion.  Skin:    General: Skin is warm and dry.     Findings: No rash.  Neurological:     Mental Status: She is alert.  Psychiatric:        Mood and Affect: Mood normal.      UC Treatments / Results  Labs (all labs ordered are listed, but only abnormal results are displayed) Labs Reviewed  POCT URINALYSIS DIP (DEVICE)  POC URINE PREG, ED    EKG   Radiology No results found.  Procedures Procedures (including critical care time)  Medications Ordered in UC Medications - No data to  display  Initial Impression / Assessment and Plan / UC Course  I have reviewed the triage vital signs and the nursing notes.  Pertinent labs & imaging results that were available during my care of the patient were reviewed by me and considered in my medical decision making (see chart for details).     Acute vaginitis Treating for yeast infection with diflucan Pregnancy test negative UA negative Final Clinical Impressions(s) / UC Diagnoses   Final diagnoses:  Acute vaginitis     Discharge Instructions     Treating you for yeast infection Take the medicine as prescribed Follow up as needed for continued or worsening symptoms     ED Prescriptions    Medication Sig Dispense Auth. Provider   fluconazole (DIFLUCAN) 150 MG tablet Take 1 tablet (150 mg total) by mouth daily. 2 tablet Dahlia Byes A, NP     PDMP not reviewed this encounter.   Janace Aris, NP 12/24/19 1954

## 2020-03-22 ENCOUNTER — Ambulatory Visit (INDEPENDENT_AMBULATORY_CARE_PROVIDER_SITE_OTHER): Payer: Medicaid Other

## 2020-03-22 ENCOUNTER — Encounter (HOSPITAL_COMMUNITY): Payer: Self-pay | Admitting: *Deleted

## 2020-03-22 ENCOUNTER — Other Ambulatory Visit: Payer: Self-pay

## 2020-03-22 ENCOUNTER — Ambulatory Visit (HOSPITAL_COMMUNITY)
Admission: EM | Admit: 2020-03-22 | Discharge: 2020-03-22 | Disposition: A | Discharge status: Home / Self Care | Payer: Medicaid Other | Attending: Family Medicine | Admitting: Family Medicine

## 2020-03-22 DIAGNOSIS — Z791 Long term (current) use of non-steroidal anti-inflammatories (NSAID): Secondary | ICD-10-CM | POA: Insufficient documentation

## 2020-03-22 DIAGNOSIS — Z87891 Personal history of nicotine dependence: Secondary | ICD-10-CM | POA: Insufficient documentation

## 2020-03-22 DIAGNOSIS — R0789 Other chest pain: Secondary | ICD-10-CM | POA: Diagnosis not present

## 2020-03-22 DIAGNOSIS — J029 Acute pharyngitis, unspecified: Secondary | ICD-10-CM | POA: Diagnosis not present

## 2020-03-22 DIAGNOSIS — R0602 Shortness of breath: Secondary | ICD-10-CM

## 2020-03-22 DIAGNOSIS — R058 Other specified cough: Secondary | ICD-10-CM | POA: Diagnosis not present

## 2020-03-22 DIAGNOSIS — J069 Acute upper respiratory infection, unspecified: Secondary | ICD-10-CM

## 2020-03-22 DIAGNOSIS — Z20822 Contact with and (suspected) exposure to covid-19: Secondary | ICD-10-CM | POA: Insufficient documentation

## 2020-03-22 DIAGNOSIS — J40 Bronchitis, not specified as acute or chronic: Secondary | ICD-10-CM | POA: Insufficient documentation

## 2020-03-22 DIAGNOSIS — Z79899 Other long term (current) drug therapy: Secondary | ICD-10-CM | POA: Insufficient documentation

## 2020-03-22 LAB — SARS CORONAVIRUS 2 (TAT 6-24 HRS): SARS Coronavirus 2: NEGATIVE

## 2020-03-22 MED ORDER — PREDNISONE 10 MG PO TABS: ORAL_TABLET | ORAL | 0 refills | Status: DC

## 2020-03-22 MED ORDER — ALBUTEROL SULFATE HFA 108 (90 BASE) MCG/ACT IN AERS: 1.0000 | INHALATION_SPRAY | Freq: Four times a day (QID) | RESPIRATORY_TRACT | 0 refills | Status: AC | PRN

## 2020-03-22 MED ORDER — PROMETHAZINE-DM 6.25-15 MG/5ML PO SYRP: 5.0000 mL | ORAL_SOLUTION | Freq: Four times a day (QID) | ORAL | 0 refills | Status: DC | PRN

## 2020-03-22 NOTE — ED Provider Notes (Signed)
[facility]    CSN: [id number] Arrival date & time: 03/22/20  0801      History   Chief Complaint Chief Complaint  Patient presents with  . Chest Pain  . Sore Throat  . Headache  . Cough    HPI Linda Mcmahon is a 34 y.o. female.   Here today with several days of sore throat, headache, hacking cough, wheezing, chest tightness and pain, fatigue. Denies known fever, body aches, N/V/D, rashes. Notes albuterol inhaler helps with her chest tightness sxs. Not otherwise trying anything for sxs at this time. Unknown if any sick contacts. Does have a hx of asthma.      Past Medical History:  Diagnosis Date  . Bronchitis   . Medical history non-contributory     There are no problems to display for this patient.   Past Surgical History:  Procedure Laterality Date  . NO PAST SURGERIES      OB History    Gravida  1   Para  1   Term      Preterm  1   AB      Living  1     SAB      TAB      Ectopic      Multiple      Live Births               Home Medications    Prior to Admission medications   Medication Sig Start Date End Date Taking? Authorizing Provider  acetaminophen (TYLENOL) 500 MG tablet Take 1,000 mg by mouth every 6 (six) hours as needed for moderate pain.   Yes [provider]  albuterol (VENTOLIN HFA) 108 (90 Base) MCG/ACT inhaler Inhale 1-2 puffs into the lungs every 6 (six) hours as needed for wheezing or shortness of breath. 03/22/20   Particia Nearing, PA-C  fluconazole (DIFLUCAN) 150 MG tablet Take 1 tablet (150 mg total) by mouth daily. 12/24/19   Dahlia Byes A, NP  meclizine (ANTIVERT) 25 MG tablet Take 1 tablet (25 mg total) by mouth 2 (two) times daily as needed for dizziness. Patient not taking: Reported on 10/15/2017 08/27/17   Belinda Fisher, PA-C  meloxicam (MOBIC) 15 MG tablet Take 1 tablet (15 mg total) by mouth daily. Patient not taking: Reported on 12/24/2019 03/04/18   Georgetta Haber, NP  predniSONE  (DELTASONE) 10 MG tablet Take 6 tabs day one, 5 tabs day two, 4 tabs day three, etc 03/22/20   Particia Nearing, PA-C  promethazine-dextromethorphan (PROMETHAZINE-DM) 6.25-15 MG/5ML syrup Take 5 mLs by mouth 4 (four) times daily as needed for cough. 03/22/20   Particia Nearing, PA-C    Family History Family History  Problem Relation Age of Onset  . Cancer Maternal Grandmother     Social History Social History   Tobacco Use  . Smoking status: Former Games developer  . Smokeless tobacco: Never Used  . Tobacco comment: stopped 8 months ago  Vaping Use  . Vaping Use: Never used  Substance Use Topics  . Alcohol use: Yes    Comment: occasionaly  . Drug use: No     Allergies   Patient has no known allergies.   Review of Systems Review of Systems PER HPI   Physical Exam Triage Vital Signs ED Triage Vitals  Enc Vitals Group     BP 03/22/20 0831 114/76     Pulse Rate 03/22/20 0831 73     Resp 03/22/20 0831 (!)  22     Temp 03/22/20 0831 98.6 F (37 C)     Temp Source 03/22/20 0831 Oral     SpO2 03/22/20 0831 100 %     Weight --      Height --      Head Circumference --      Peak Flow --      Pain Score 03/22/20 0835 8     Pain Loc --      Pain Edu? --      Excl. in GC? --    No data found.  Updated Vital Signs BP 114/76 (BP Location: Right Arm)   Pulse 73   Temp 98.6 F (37 C) (Oral)   Resp (!) 22   LMP 03/15/2020   SpO2 100%   Visual Acuity Right Eye Distance:   Left Eye Distance:   Bilateral Distance:    Right Eye Near:   Left Eye Near:    Bilateral Near:     Physical Exam Vitals and nursing note reviewed.  Constitutional:      Appearance: Normal appearance. She is not ill-appearing.  HENT:     Head: Atraumatic.     Right Ear: Tympanic membrane normal.     Left Ear: Tympanic membrane normal.     Nose: Rhinorrhea present.     Mouth/Throat:     Mouth: Mucous membranes are moist.     Pharynx: Posterior oropharyngeal erythema present.  Eyes:       Extraocular Movements: Extraocular movements intact.     Conjunctiva/sclera: Conjunctivae normal.  Cardiovascular:     Rate and Rhythm: Normal rate and regular rhythm.     Heart sounds: Normal heart sounds.  Pulmonary:     Effort: Pulmonary effort is normal.     Breath sounds: Wheezing (diffuse) present. No rales.  Abdominal:     General: Bowel sounds are normal. There is no distension.     Palpations: Abdomen is soft.     Tenderness: There is no abdominal tenderness. There is no guarding.  Musculoskeletal:        General: Normal range of motion.     Cervical back: Normal range of motion and neck supple.  Skin:    General: Skin is warm and dry.  Neurological:     Mental Status: She is alert and oriented to person, place, and time.  Psychiatric:        Mood and Affect: Mood normal.        Thought Content: Thought content normal.        Judgment: Judgment normal.     UC Treatments / Results  Labs (all labs ordered are listed, but only abnormal results are displayed) Labs Reviewed  SARS CORONAVIRUS 2 (TAT 6-24 HRS)    EKG   Radiology DG Chest 2 View  Result Date: 03/22/2020 CLINICAL DATA:  Three days of worsening productive cough, chest tightness, shortness of breath. EXAM: CHEST - 2 VIEW COMPARISON:  03/04/2018 FINDINGS: The heart size and mediastinal contours are within normal limits. Both lungs are clear. No pleural effusion. No pneumothorax. The visualized skeletal structures are unremarkable. IMPRESSION: No acute cardiopulmonary disease. Electronically Signed   By: Feliberto Harts MD   On: 03/22/2020 09:36    Procedures Procedures (including critical care time)  Medications Ordered in UC Medications - No data to display  Initial Impression / Assessment and Plan / UC Course  I have reviewed the triage vital signs and the nursing notes.  Pertinent labs & imaging  results that were available during my care of the patient were reviewed by me and considered in my  medical decision making (see chart for details).     Suspect viral URI causing asthma exacerbation - EKG today sinus rhythm, with normal rate and no ST T wave changes. CXR without acute abnormality today and O2 saturation 100% on RA. Will tx with prednisone taper, albuterol inhaler, mucinex and good supportive home care. COVID pcr pending. Isolation protocol and return precautions reviewed with patient.   Final Clinical Impressions(s) / UC Diagnoses   Final diagnoses:  Viral URI with cough  Bronchitis   Discharge Instructions   None    ED Prescriptions    Medication Sig Dispense Auth. Provider   predniSONE (DELTASONE) 10 MG tablet Take 6 tabs day one, 5 tabs day two, 4 tabs day three, etc 21 tablet Particia Nearing, PA-C   albuterol (VENTOLIN HFA) 108 (90 Base) MCG/ACT inhaler Inhale 1-2 puffs into the lungs every 6 (six) hours as needed for wheezing or shortness of breath. 18 g Roosvelt Maser [place], [place]   promethazine-dextromethorphan (PROMETHAZINE-DM) 6.25-15 MG/5ML syrup Take 5 mLs by mouth 4 (four) times daily as needed for cough. 100 mL Particia Nearing, [place]     PDMP not reviewed this encounter.   Particia Nearing, [place] 03/22/20 [id number]

## 2020-03-22 NOTE — ED Triage Notes (Addendum)
Patient in with complaints of right sided chest pain that radiates to the back x 3 days. Patient states symptoms of cough, headache, and sore throat started 3 days ago as well. Diarrhea x 3 on yesterday. No over the counter medications taken for symptoms. Patient has received COVID-19 vaccinations. Patient also has complaints of dizziness with standing.

## 2020-04-11 ENCOUNTER — Other Ambulatory Visit: Payer: Self-pay | Admitting: Family Medicine

## 2020-09-26 ENCOUNTER — Ambulatory Visit (HOSPITAL_COMMUNITY)
Admission: EM | Admit: 2020-09-26 | Discharge: 2020-09-26 | Disposition: A | Discharge status: Home / Self Care | Payer: Medicaid Other | Attending: Physician Assistant | Admitting: Physician Assistant

## 2020-09-26 ENCOUNTER — Encounter (HOSPITAL_COMMUNITY): Payer: Self-pay | Admitting: Emergency Medicine

## 2020-09-26 ENCOUNTER — Other Ambulatory Visit: Payer: Self-pay

## 2020-09-26 DIAGNOSIS — K5900 Constipation, unspecified: Secondary | ICD-10-CM

## 2020-09-26 DIAGNOSIS — R1030 Lower abdominal pain, unspecified: Secondary | ICD-10-CM | POA: Diagnosis not present

## 2020-09-26 DIAGNOSIS — M545 Low back pain, unspecified: Secondary | ICD-10-CM

## 2020-09-26 DIAGNOSIS — Z3202 Encounter for pregnancy test, result negative: Secondary | ICD-10-CM

## 2020-09-26 LAB — POCT URINALYSIS DIPSTICK, ED / UC
Bilirubin Urine: NEGATIVE
Glucose, UA: NEGATIVE mg/dL
Hgb urine dipstick: NEGATIVE
Ketones, ur: NEGATIVE mg/dL
Leukocytes,Ua: NEGATIVE
Nitrite: NEGATIVE
Protein, ur: NEGATIVE mg/dL
Specific Gravity, Urine: >=1.03 (ref 1.005–1.030)
Urobilinogen, UA: 0.2 mg/dL (ref 0.0–1.0)
pH: 6 (ref 5.0–8.0)

## 2020-09-26 LAB — COMPREHENSIVE METABOLIC PANEL
ALT: 18 U/L (ref 0–44)
AST: 19 U/L (ref 15–41)
Albumin: 3.5 g/dL (ref 3.5–5.0)
Alkaline Phosphatase: 56 U/L (ref 38–126)
Anion gap: 5 (ref 5–15)
BUN: 9 mg/dL (ref 6–20)
CO2: 27 mmol/L (ref 22–32)
Calcium: 9 mg/dL (ref 8.9–10.3)
Chloride: 106 mmol/L (ref 98–111)
Creatinine, Ser: 0.74 mg/dL (ref 0.44–1.00)
GFR, Estimated: >60 mL/min (ref >60)
Glucose, Bld: 93 mg/dL (ref 70–99)
Potassium: 4.2 mmol/L (ref 3.5–5.1)
Sodium: 138 mmol/L (ref 135–145)
Total Bilirubin: 0.6 mg/dL (ref 0.3–1.2)
Total Protein: 7 g/dL (ref 6.5–8.1)

## 2020-09-26 LAB — CBC WITH DIFFERENTIAL/PLATELET
Abs Immature Granulocytes: 0.01 10*3/uL (ref 0.00–0.07)
Basophils Absolute: 0 10*3/uL (ref 0.0–0.1)
Basophils Relative: 1 %
Eosinophils Absolute: 0.1 10*3/uL (ref 0.0–0.5)
Eosinophils Relative: 3 %
HCT: 35 % — ABNORMAL LOW (ref 36.0–46.0)
Hemoglobin: 11.9 g/dL — ABNORMAL LOW (ref 12.0–15.0)
Immature Granulocytes: 0 %
Lymphocytes Relative: 44 %
Lymphs Abs: 1.9 10*3/uL (ref 0.7–4.0)
MCH: 30.5 pg (ref 26.0–34.0)
MCHC: 34 g/dL (ref 30.0–36.0)
MCV: 89.7 fL (ref 80.0–100.0)
Monocytes Absolute: 0.4 10*3/uL (ref 0.1–1.0)
Monocytes Relative: 10 %
Neutro Abs: 1.8 10*3/uL (ref 1.7–7.7)
Neutrophils Relative %: 42 %
Platelets: 353 10*3/uL (ref 150–400)
RBC: 3.9 MIL/uL (ref 3.87–5.11)
RDW: 13 % (ref 11.5–15.5)
WBC: 4.3 10*3/uL (ref 4.0–10.5)
nRBC: 0 % (ref 0.0–0.2)

## 2020-09-26 LAB — POC URINE PREG, ED: Preg Test, Ur: NEGATIVE

## 2020-09-26 NOTE — Discharge Instructions (Addendum)
Use fluid and fiber to help with bowels.  You can also try MiraLAX as we discussed.  If you have any worsening symptoms including abdominal pain or you stop passing gas you need to go directly to the emergency room.  Follow-up with PCP as we discussed.

## 2020-09-26 NOTE — ED Provider Notes (Signed)
[facility]    CSN: [id number] Arrival date & time: 09/26/20  1040      History   Chief Complaint Chief Complaint  Patient presents with  . Abdominal Pain  . Back Pain    HPI Linda Mcmahon is a 35 y.o. female.   Patient presents today with a 3-day history of lower abdominal pain.  Reports pain is rated 8 on a 0-10 pain scale, localized to lower abdomen with radiation into back, described as shooting, worse with ambulation, no alleviating factors identified.  She reports associated nausea and constipation.  She was given a stool softener on Friday which allowed her to have a bowel movement and briefly improved symptoms but states this was her last bowel movement (3 days ago).  She denies any vomiting, melena, hematochezia, obstipation, fever.  She is eating and drinking normally.  Denies history of abdominal surgery.  Denies any recent antibiotic use, medication changes, known sick contacts.  She reports pain is worse with intercourse and slightly worse in the right lower quadrant compared to left.  She denies history of ovarian cyst.  She denies any pelvic pain, vaginal complaints, urinary symptoms.  Reports pain has been stable since onset.  She has not tried any over-the-counter medications for symptom management.     Past Medical History:  Diagnosis Date  . Bronchitis   . Medical history non-contributory     There are no problems to display for this patient.   Past Surgical History:  Procedure Laterality Date  . NO PAST SURGERIES      OB History    Gravida  1   Para  1   Term      Preterm  1   AB      Living  1     SAB      IAB      Ectopic      Multiple      Live Births               Home Medications    Prior to Admission medications   Medication Sig Start Date End Date Taking? Authorizing Provider  acetaminophen (TYLENOL) 500 MG tablet Take 1,000 mg by mouth every 6 (six) hours as needed for moderate pain.    [provider]  albuterol (VENTOLIN HFA) 108 (90 Base) MCG/ACT inhaler Inhale 1-2 puffs into the lungs every 6 (six) hours as needed for wheezing or shortness of breath. 03/22/20   Particia Nearing, PA-C  fluconazole (DIFLUCAN) 150 MG tablet Take 1 tablet (150 mg total) by mouth daily. 12/24/19   Dahlia Byes A, NP  meclizine (ANTIVERT) 25 MG tablet Take 1 tablet (25 mg total) by mouth 2 (two) times daily as needed for dizziness. Patient not taking: Reported on 10/15/2017 08/27/17   Belinda Fisher, PA-C  meloxicam (MOBIC) 15 MG tablet Take 1 tablet (15 mg total) by mouth daily. Patient not taking: Reported on 12/24/2019 03/04/18   Georgetta Haber, NP  predniSONE (DELTASONE) 10 MG tablet Take 6 tabs day one, 5 tabs day two, 4 tabs day three, etc 03/22/20   Particia Nearing, PA-C  promethazine-dextromethorphan (PROMETHAZINE-DM) 6.25-15 MG/5ML syrup Take 5 mLs by mouth 4 (four) times daily as needed for cough. 03/22/20   Particia Nearing, PA-C    Family History Family History  Problem Relation Age of Onset  . Cancer Maternal Grandmother     Social History Social History   Tobacco Use  .  Smoking status: Former Games developer  . Smokeless tobacco: Never Used  . Tobacco comment: stopped 8 months ago  Vaping Use  . Vaping Use: Never used  Substance Use Topics  . Alcohol use: Yes    Comment: occasionaly  . Drug use: No     Allergies   Patient has no known allergies.   Review of Systems Review of Systems  Constitutional: Negative for activity change, appetite change, fatigue and fever.  Respiratory: Negative for cough and shortness of breath.   Cardiovascular: Negative for chest pain.  Gastrointestinal: Positive for abdominal pain, constipation and nausea. Negative for diarrhea and vomiting.  Genitourinary: Negative for dysuria, frequency, urgency, vaginal bleeding, vaginal discharge and vaginal pain.  Musculoskeletal: Negative for arthralgias and myalgias.  Neurological: Negative for  dizziness, light-headedness and headaches.     Physical Exam Triage Vital Signs ED Triage Vitals  Enc Vitals Group     BP 09/26/20 1126 116/74     Pulse Rate 09/26/20 1126 62     Resp 09/26/20 1126 17     Temp 09/26/20 1126 97.9 F (36.6 C)     Temp Source 09/26/20 1126 Oral     SpO2 09/26/20 1126 97 %     Weight --      Height --      Head Circumference --      Peak Flow --      Pain Score 09/26/20 1125 8     Pain Loc --      Pain Edu? --      Excl. in GC? --    No data found.  Updated Vital Signs BP 116/74 (BP Location: Left Arm)   Pulse 62   Temp 97.9 F (36.6 C) (Oral)   Resp 17   LMP 09/06/2020   SpO2 97%   Visual Acuity Right Eye Distance:   Left Eye Distance:   Bilateral Distance:    Right Eye Near:   Left Eye Near:    Bilateral Near:     Physical Exam Vitals reviewed.  Constitutional:      General: She is awake. She is not in acute distress.    Appearance: Normal appearance. She is not ill-appearing.     Comments: They pleasant female appears stated age in no acute distress  HENT:     Head: Normocephalic and atraumatic.  Cardiovascular:     Rate and Rhythm: Normal rate and regular rhythm.     Heart sounds: No murmur heard.   Pulmonary:     Effort: Pulmonary effort is normal.     Breath sounds: Normal breath sounds. No wheezing, rhonchi or rales.     Comments: Clear to auscultation bilaterally Abdominal:     General: Bowel sounds are normal.     Palpations: Abdomen is soft.     Tenderness: There is abdominal tenderness in the right lower quadrant, suprapubic area and left lower quadrant. There is no right CVA tenderness, left CVA tenderness, guarding or rebound.     Comments: Penetration throughout lower abdomen.  No evidence of acute abdomen on physical exam.  No CVA tenderness.  Musculoskeletal:     Right lower leg: No edema.     Left lower leg: No edema.  Psychiatric:        Behavior: Behavior is cooperative.      UC Treatments /  Results  Labs (all labs ordered are listed, but only abnormal results are displayed) Labs Reviewed  CBC WITH DIFFERENTIAL/PLATELET  COMPREHENSIVE METABOLIC PANEL  POCT  URINALYSIS DIPSTICK, ED / UC  POC URINE PREG, ED    EKG   Radiology No results found.  Procedures Procedures (including critical care time)  Medications Ordered in UC Medications - No data to display  Initial Impression / Assessment and Plan / UC Course  I have reviewed the triage vital signs and the nursing notes.  Pertinent labs & imaging results that were available during my care of the patient were reviewed by me and considered in my medical decision making (see chart for details).     Discussed potential utility of going to the emergency room for further evaluation and imaging.  Patient declined to do this so we will try conservative treatment including bland diet and plenty of fluid.  CBC and CMP obtained today-results pending.  Discussed that if she has any sort of elevated white count or electrolyte abnormality she would need to go to the emergency room.  Patient reports she has follow-up with PCP scheduled for tomorrow and intends to keep this appointment.  Strict return precautions given to which patient expressed understanding.  Final Clinical Impressions(s) / UC Diagnoses   Final diagnoses:  Lower abdominal pain  Acute bilateral low back pain without sciatica  Constipation, unspecified constipation type     Discharge Instructions     Use fluid and fiber to help with bowels.  You can also try MiraLAX as we discussed.  If you have any worsening symptoms including abdominal pain or you stop passing gas you need to go directly to the emergency room.  Follow-up with PCP as we discussed.    ED Prescriptions    None     PDMP not reviewed this encounter.   Jeani Hawking, PA-C 09/26/20 1212

## 2020-09-26 NOTE — ED Triage Notes (Signed)
Pt presents with lower abdominal pain and lower back pain xs 1 week. States this has been on and off for the past year.  Denies any urinary symptoms.

## 2021-01-05 ENCOUNTER — Ambulatory Visit (HOSPITAL_COMMUNITY)
Admission: EM | Admit: 2021-01-05 | Discharge: 2021-01-05 | Disposition: A | Discharge status: Home / Self Care | Payer: Medicaid Other | Attending: Nurse Practitioner | Admitting: Nurse Practitioner

## 2021-01-05 ENCOUNTER — Other Ambulatory Visit: Payer: Self-pay

## 2021-01-05 ENCOUNTER — Encounter (HOSPITAL_COMMUNITY): Payer: Self-pay

## 2021-01-05 DIAGNOSIS — Z20822 Contact with and (suspected) exposure to covid-19: Secondary | ICD-10-CM | POA: Diagnosis not present

## 2021-01-05 DIAGNOSIS — R058 Other specified cough: Secondary | ICD-10-CM | POA: Insufficient documentation

## 2021-01-05 DIAGNOSIS — Z1152 Encounter for screening for COVID-19: Secondary | ICD-10-CM | POA: Diagnosis not present

## 2021-01-05 DIAGNOSIS — J069 Acute upper respiratory infection, unspecified: Secondary | ICD-10-CM | POA: Diagnosis present

## 2021-01-05 LAB — SARS CORONAVIRUS 2 (TAT 6-24 HRS): SARS Coronavirus 2: POSITIVE — AB

## 2021-01-05 MED ORDER — IBUPROFEN 800 MG PO TABS
ORAL_TABLET | ORAL | Status: AC
  Filled 2021-01-05: qty 1

## 2021-01-05 MED ORDER — PROMETHAZINE-DM 6.25-15 MG/5ML PO SYRP: 10.0000 mL | ORAL_SOLUTION | Freq: Four times a day (QID) | ORAL | 0 refills | Status: AC | PRN

## 2021-01-05 MED ORDER — IBUPROFEN 600 MG PO TABS: 600.0000 mg | ORAL_TABLET | Freq: Four times a day (QID) | ORAL | 0 refills | Status: AC | PRN

## 2021-01-05 MED ORDER — IBUPROFEN 800 MG PO TABS
800.0000 mg | ORAL_TABLET | Freq: Once | ORAL | Status: AC
  Administered 2021-01-05: 800 mg via ORAL

## 2021-01-05 NOTE — ED Triage Notes (Signed)
Pt presents with generalized body aches, chills, non productive cough, and chills, since yesterday.

## 2021-01-05 NOTE — Discharge Instructions (Addendum)
Take medications as prescribed. You may take tylenol or ibuprofen as needed for fevers/headache/body aches. Drink plenty of fluids. Stay in home isolation until you receive results of your COVID test. You will only be notified for positive results. You may go online to MyChart in the next few days and review your results. You may discontinue home isolation when there has been at least 5 days since symptoms onset AND fever free without antipyretics (Tylenol or Ibuprofen) AND an overall improvement in your symptoms. Go to the ED immediately if you get worse or have any other symptoms.   Feel better soon!  Lelon Mast, FNP-C

## 2021-01-05 NOTE — ED Provider Notes (Signed)
[facility]    CSN: [id number] Arrival date & time: 01/05/21  8280      History   Chief Complaint Chief Complaint  Patient presents with   Cough   Chills   Generalized Body Aches    HPI Linda Mcmahon is a 35 y.o. female.   Subjective:   Linda Mcmahon is a 35 y.o. female who presents for evaluation of COVID like symptoms. Symptoms include fevers up to 100.4 degrees, chest pain during cough, chills, headache, myalgias and cough. Symptoms have been present for 1 day. She has tried to alleviate the symptoms with home remedies such as honey, lemon, ginger and garlic with minimal relief. High risk factors for COVID complications: none. 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 She denies any personal history of COVID. She has not been vaccinated against COVID.   The following portions of the patient's history were reviewed and updated as appropriate: allergies, current medications, past family history, past medical history, past social history, past surgical history, and problem list.     Past Medical History:  Diagnosis Date   Bronchitis    Medical history non-contributory     There are no problems to display for this patient.   Past Surgical History:  Procedure Laterality Date   NO PAST SURGERIES      OB History     Gravida  1   Para  1   Term      Preterm  1   AB      Living  1      SAB      IAB      Ectopic      Multiple      Live Births               Home Medications    Prior to Admission medications   Medication Sig Start Date End Date Taking? Authorizing Provider  ibuprofen (ADVIL) 600 MG tablet Take 1 tablet (600 mg total) by mouth every 6 (six) hours as needed. 01/05/21  Yes Lurline Idol, FNP  promethazine-dextromethorphan (PROMETHAZINE-DM) 6.25-15 MG/5ML syrup Take 10 mLs by mouth every 6 (six) hours as needed for cough. 01/05/21  Yes  Lurline Idol, FNP  acetaminophen (TYLENOL) 500 MG tablet Take 1,000 mg by mouth every 6 (six) hours as needed for moderate pain.    [provider]  albuterol (VENTOLIN HFA) 108 (90 Base) MCG/ACT inhaler Inhale 1-2 puffs into the lungs every 6 (six) hours as needed for wheezing or shortness of breath. 03/22/20   Particia Nearing, PA-C  meclizine (ANTIVERT) 25 MG tablet Take 1 tablet (25 mg total) by mouth 2 (two) times daily as needed for dizziness. Patient not taking: Reported on 10/15/2017 08/27/17   Belinda Fisher, PA-C  meloxicam (MOBIC) 15 MG tablet Take 1 tablet (15 mg total) by mouth daily. Patient not taking: Reported on 12/24/2019 03/04/18   Georgetta Haber, NP    Family History Family History  Problem Relation Age of Onset   Cancer Maternal Grandmother     Social History Social History   Tobacco Use   Smoking status: Former   Smokeless tobacco: Never   Tobacco comments:    stopped 8 months ago  Vaping Use   Vaping Use: Never used  Substance Use Topics   Alcohol use: Yes    Comment: occasionaly   Drug use: No     Allergies  Patient has no known allergies.   Review of Systems Review of Systems   Physical Exam Triage Vital Signs ED Triage Vitals  Enc Vitals Group     BP 01/05/21 1106 93/75     Pulse Rate 01/05/21 1106 97     Resp 01/05/21 1106 17     Temp 01/05/21 1106 99.2 F (37.3 C)     Temp Source 01/05/21 1106 Oral     SpO2 01/05/21 1106 98 %     Weight --      Height --      Head Circumference --      Peak Flow --      Pain Score 01/05/21 1108 7     Pain Loc --      Pain Edu? --      Excl. in GC? --    No data found.  Updated Vital Signs BP 93/75 (BP Location: Left Arm)   Pulse 97   Temp 99.2 F (37.3 C) (Oral)   Resp 17   LMP 12/28/2020   SpO2 98%   Visual Acuity Right Eye Distance:   Left Eye Distance:   Bilateral Distance:    Right Eye Near:   Left Eye Near:    Bilateral Near:     Physical Exam   UC  Treatments / Results  Labs (all labs ordered are listed, but only abnormal results are displayed) Labs Reviewed  SARS CORONAVIRUS 2 (TAT 6-24 HRS)    EKG   Radiology No results found.  Procedures Procedures (including critical care time)  Medications Ordered in UC Medications  ibuprofen (ADVIL) tablet 800 mg (has no administration in time range)    Initial Impression / Assessment and Plan / UC Course  I have reviewed the triage vital signs and the nursing notes.  Pertinent labs & imaging results that were available during my care of the patient were reviewed by me and considered in my medical decision making (see chart for details).     35 yo female presenting with fevers, chest pain during cough, chills, headache, myalgias and cough. She has known exposures to COVID recently. She has not been vaccinated against COVID. She is AAOx3. Acutely ill-appearing but non-toxic. Low grade fever of 99.2.    Plan: COVID test pending  Isolate at home until results received  Supportive care with appropriate antipyretics and fluids. Educational material distributed and questions answered.  Today's evaluation has revealed no signs of a dangerous process. Discussed diagnosis with patient and/or guardian. Patient and/or guardian aware of their diagnosis, possible red flag symptoms to watch out for and need for close follow up. Patient and/or guardian understands verbal and written discharge instructions. Patient and/or guardian comfortable with plan and disposition.  Patient and/or guardian has a clear mental status at this time, good insight into illness (after discussion and teaching) and has clear judgment to make decisions regarding their care  This care was provided during an unprecedented National Emergency due to the Novel Coronavirus (COVID-19) pandemic. COVID-19 infections and transmission risks place heavy strains on healthcare resources.  As this pandemic evolves, our facility,  providers, and staff strive to respond fluidly, to remain operational, and to provide care relative to available resources and information. Outcomes are unpredictable and treatments are without well-defined guidelines. Further, the impact of COVID-19 on all aspects of urgent care, including the impact to patients seeking care for reasons other than COVID-19, is unavoidable during this national emergency. At this time of the global pandemic, management  of patients has significantly changed, even for non-COVID positive patients given high local and regional COVID volumes at this time requiring high healthcare system and resource utilization. The standard of care for management of both COVID suspected and non-COVID suspected patients continues to change rapidly at the local, regional, national, and global levels. This patient was worked up and treated to the best available but ever changing evidence and resources available at this current time.   Documentation was completed with the aid of voice recognition software. Transcription may contain typographical errors.  Final Clinical Impressions(s) / UC Diagnoses   Final diagnoses:  Viral upper respiratory tract infection  Cough with exposure to COVID-19 virus  Encounter for screening for COVID-19     Discharge Instructions      Take medications as prescribed. You may take tylenol or ibuprofen as needed for fevers/headache/body aches. Drink plenty of fluids. Stay in home isolation until you receive results of your COVID test. You will only be notified for positive results. You may go online to MyChart in the next few days and review your results. You may discontinue home isolation when there has been at least 5 days since symptoms onset AND fever free without antipyretics (Tylenol or Ibuprofen) AND an overall improvement in your symptoms. Go to the ED immediately if you get worse or have any other symptoms.   Feel better soon!  Lelon Mast, FNP-C        ED Prescriptions     Medication Sig Dispense Auth. Provider   promethazine-dextromethorphan (PROMETHAZINE-DM) 6.25-15 MG/5ML syrup Take 10 mLs by mouth every 6 (six) hours as needed for cough. 120 mL Lurline Idol, FNP   ibuprofen (ADVIL) 600 MG tablet Take 1 tablet (600 mg total) by mouth every 6 (six) hours as needed. 30 tablet Lurline Idol, FNP      PDMP not reviewed this encounter.   Lurline Idol, [place] 01/05/21 1140

## 2022-09-21 IMAGING — DX DG CHEST 2V
2 series · 2 of 2 positions shown · non-contrast
Comparison: 03/04/2018

CLINICAL DATA: Three days of worsening productive cough, chest
tightness, shortness of breath.

EXAM:
CHEST - 2 VIEW

[chest ap]
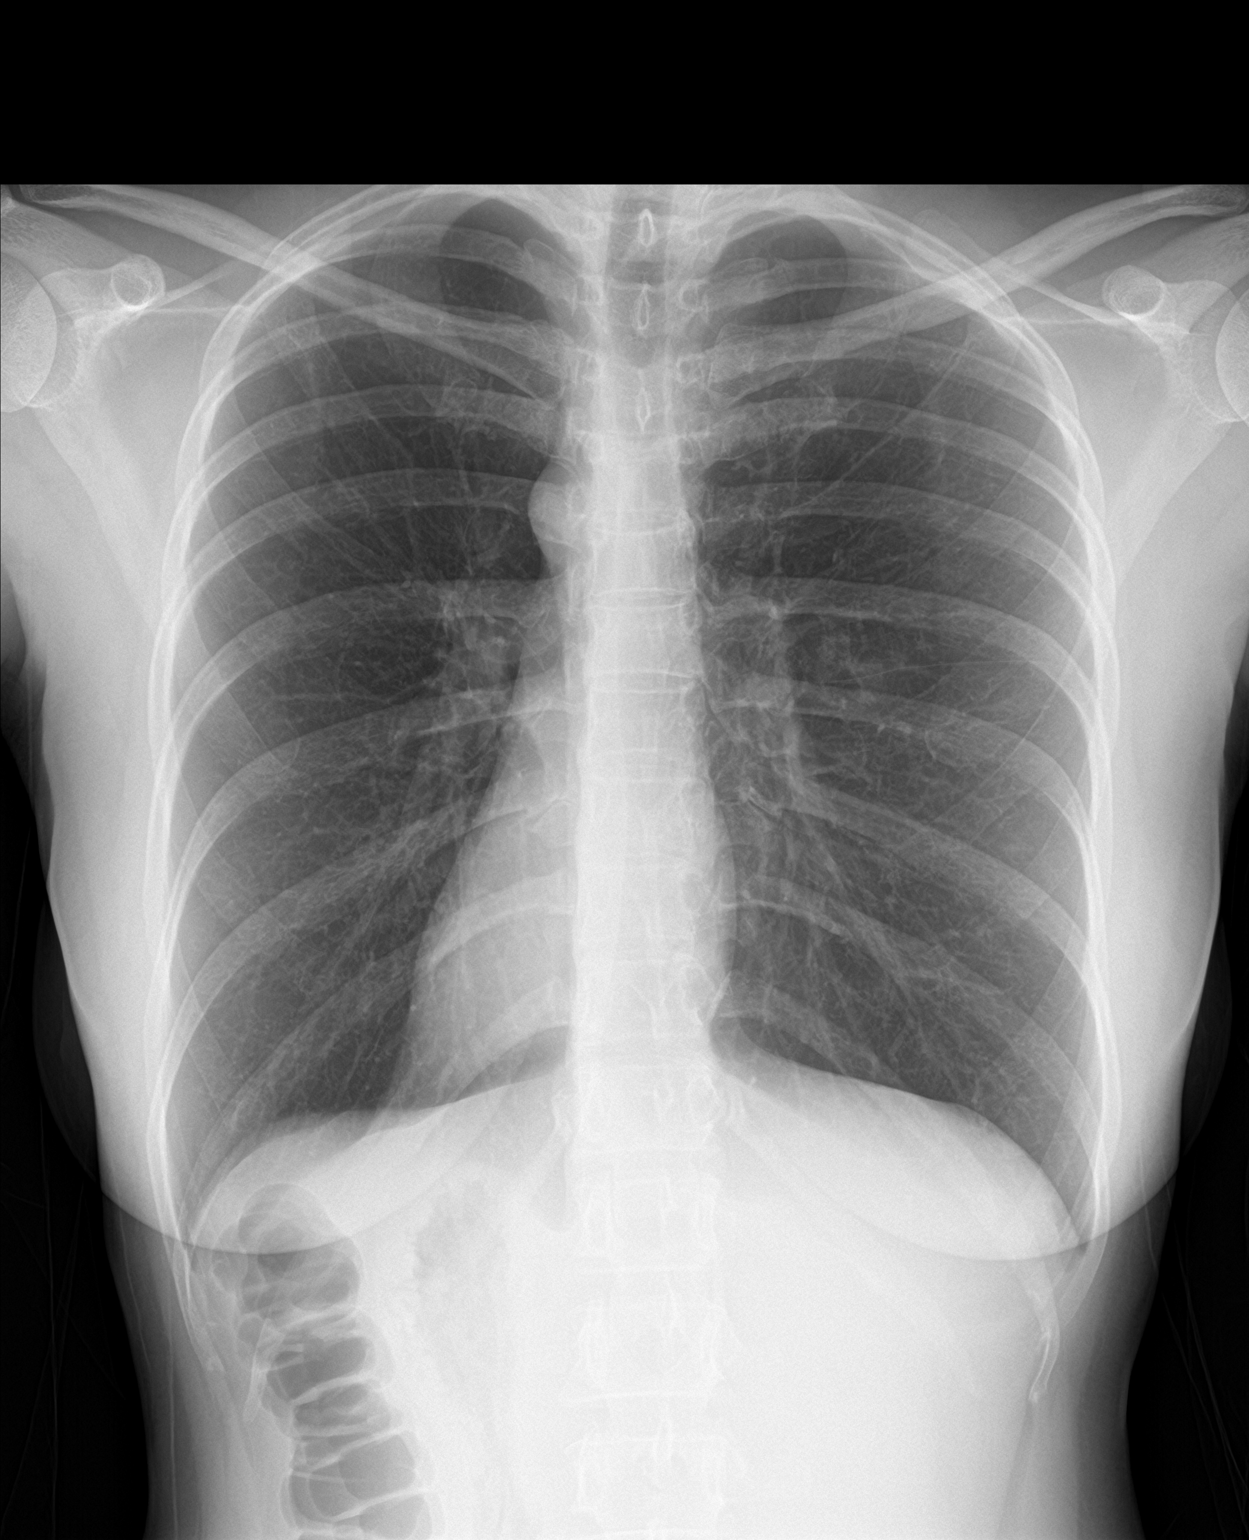

[chest lat]
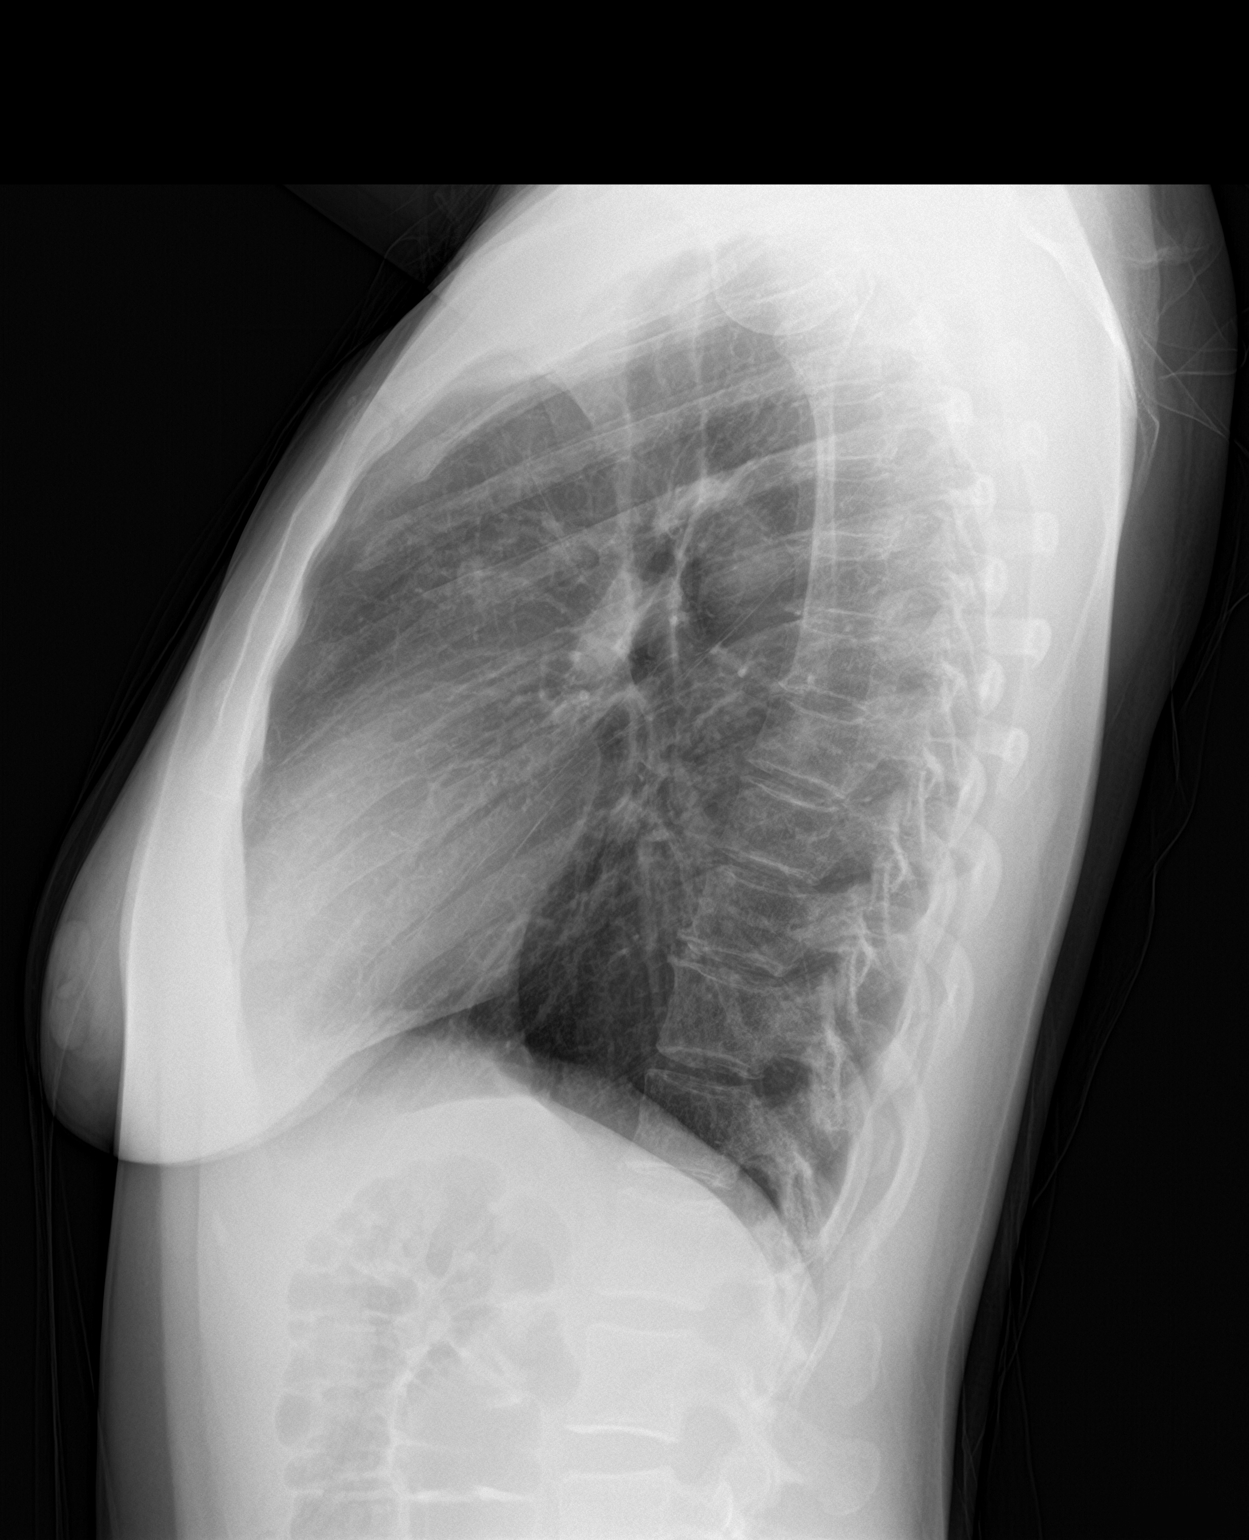

[2 of 2 positions shown; findings below may reference images not displayed]

FINDINGS: The heart size and mediastinal contours are within normal limits.
Both lungs are clear. No pleural effusion. No pneumothorax. The
visualized skeletal structures are unremarkable.
IMPRESSION: No acute cardiopulmonary disease.

## 2023-12-12 ENCOUNTER — Emergency Department (HOSPITAL_COMMUNITY)
Admission: EM | Admit: 2023-12-12 | Discharge: 2023-12-13 | Discharge status: Against Medical Advice | Attending: Emergency Medicine | Admitting: Emergency Medicine

## 2023-12-12 ENCOUNTER — Other Ambulatory Visit: Payer: Self-pay

## 2023-12-12 ENCOUNTER — Encounter (HOSPITAL_COMMUNITY): Payer: Self-pay | Admitting: *Deleted

## 2023-12-12 DIAGNOSIS — Z5321 Procedure and treatment not carried out due to patient leaving prior to being seen by health care provider: Secondary | ICD-10-CM | POA: Insufficient documentation

## 2023-12-12 DIAGNOSIS — R110 Nausea: Secondary | ICD-10-CM | POA: Insufficient documentation

## 2023-12-12 DIAGNOSIS — R109 Unspecified abdominal pain: Secondary | ICD-10-CM | POA: Diagnosis present

## 2023-12-12 DIAGNOSIS — R1030 Lower abdominal pain, unspecified: Secondary | ICD-10-CM | POA: Diagnosis not present

## 2023-12-12 LAB — COMPREHENSIVE METABOLIC PANEL WITH GFR
ALT: 12 U/L (ref 0–44)
AST: 17 U/L (ref 15–41)
Albumin: 4 g/dL (ref 3.5–5.0)
Alkaline Phosphatase: 43 U/L (ref 38–126)
Anion gap: 13 (ref 5–15)
BUN: 12 mg/dL (ref 6–20)
CO2: 19 mmol/L — ABNORMAL LOW (ref 22–32)
Calcium: 9.3 mg/dL (ref 8.9–10.3)
Chloride: 106 mmol/L (ref 98–111)
Creatinine, Ser: 0.85 mg/dL (ref 0.44–1.00)
GFR, Estimated: >60 mL/min (ref >60)
Glucose, Bld: 129 mg/dL — ABNORMAL HIGH (ref 70–99)
Potassium: 3.1 mmol/L — ABNORMAL LOW (ref 3.5–5.1)
Sodium: 138 mmol/L (ref 135–145)
Total Bilirubin: 0.8 mg/dL (ref 0.0–1.2)
Total Protein: 7 g/dL (ref 6.5–8.1)

## 2023-12-12 LAB — CBC
HCT: 33.9 % — ABNORMAL LOW (ref 36.0–46.0)
Hemoglobin: 11.9 g/dL — ABNORMAL LOW (ref 12.0–15.0)
MCH: 31.8 pg (ref 26.0–34.0)
MCHC: 35.1 g/dL (ref 30.0–36.0)
MCV: 90.6 fL (ref 80.0–100.0)
Platelets: 370 10*3/uL (ref 150–400)
RBC: 3.74 MIL/uL — ABNORMAL LOW (ref 3.87–5.11)
RDW: 13.1 % (ref 11.5–15.5)
WBC: 6.1 10*3/uL (ref 4.0–10.5)
nRBC: 0 % (ref 0.0–0.2)

## 2023-12-12 LAB — LIPASE, BLOOD: Lipase: 34 U/L (ref 11–51)

## 2023-12-12 LAB — HCG, SERUM, QUALITATIVE: Preg, Serum: NEGATIVE

## 2023-12-12 NOTE — ED Triage Notes (Signed)
 Pt arrives from home on her period, severe cramping. Nausea, no vomiting, last BM yesterday. Initial 90/50, given 500ml fluid and 4 zofran given to IV established  the left AC.  last bp 112/60, hr 62. SPO2 100% CBG 110

## 2023-12-12 NOTE — ED Provider Triage Note (Signed)
 Emergency Medicine Provider Triage Evaluation Note  Linda Mcmahon , a 38 y.o. female  was evaluated in triage.  Pt complains of abdominal cramping, heavy menses, painful bowel movement.  Patient started her menstrual cycle today, reports it is heavier than normal, like dripping.  She went to have a bowel movement and then became diaphoretic and fell like she may pass out but she did not lose consciousness.  She continues to have cramping abdominal pain.  Denies chest pain, shortness of breath, fever.  Review of Systems  Positive: As above Negative: As above  Physical Exam  BP 106/72 (BP Location: Right Arm)   Pulse 68   Temp 98.2 F (36.8 C) (Oral)   Resp (!) 22   LMP 12/12/2023   SpO2 100%  Gen:   Awake, no distress   Resp:  Normal effort  MSK:   Moves extremities without difficulty  Other:  Abdomen is soft, mild tenderness to palpation in the suprapubic region  Medical Decision Making  Medically screening exam initiated at 9:43 PM.  Appropriate orders placed.  Linda Mcmahon was informed that the remainder of the evaluation will be completed by another provider, this initial triage assessment does not replace that evaluation, and the importance of remaining in the ED until their evaluation is complete.     Linda Mcmahon SAILOR, [place] 12/12/23 2145

## 2023-12-12 NOTE — ED Triage Notes (Signed)
 Pt reporting she has had abdominal cramping today and started her period. Reports that she went to the restroom to have a BM and started getting hot and could not complete her BM. Heavier menstrual cycle than normal.

## 2023-12-13 NOTE — ED Notes (Signed)
 Pt left AMA
